# Patient Record
Sex: Male | Born: 1970 | Race: Black or African American | Hispanic: No | Marital: Married | Smoking: Never smoker
Health system: Southern US, Community
[De-identification: ages and names within clinical notes are randomized; demographics above are authoritative.]

## PROBLEM LIST (undated history)

## (undated) DIAGNOSIS — J310 Chronic rhinitis: Secondary | ICD-10-CM

## (undated) DIAGNOSIS — G4733 Obstructive sleep apnea (adult) (pediatric): Secondary | ICD-10-CM

## (undated) DIAGNOSIS — K219 Gastro-esophageal reflux disease without esophagitis: Secondary | ICD-10-CM

## (undated) DIAGNOSIS — F329 Major depressive disorder, single episode, unspecified: Secondary | ICD-10-CM

## (undated) DIAGNOSIS — R51 Headache: Secondary | ICD-10-CM

## (undated) DIAGNOSIS — F32A Depression, unspecified: Secondary | ICD-10-CM

## (undated) DIAGNOSIS — G8929 Other chronic pain: Secondary | ICD-10-CM

## (undated) HISTORY — DX: Obstructive sleep apnea (adult) (pediatric): G47.33

## (undated) HISTORY — DX: Headache: R51

## (undated) HISTORY — PX: APPENDECTOMY: SHX54

## (undated) HISTORY — DX: Chronic rhinitis: J31.0

## (undated) HISTORY — DX: Other chronic pain: G89.29

## (undated) HISTORY — PX: ACHILLES TENDON REPAIR: SUR1153

## (undated) HISTORY — DX: Gastro-esophageal reflux disease without esophagitis: K21.9

---

## 2011-05-20 ENCOUNTER — Emergency Department (HOSPITAL_BASED_OUTPATIENT_CLINIC_OR_DEPARTMENT_OTHER)
Admission: EM | Admit: 2011-05-20 | Discharge: 2011-05-20 | Disposition: A | Discharge status: Home / Self Care | Payer: Commercial Managed Care - PPO | Attending: Emergency Medicine | Admitting: Emergency Medicine

## 2011-05-20 ENCOUNTER — Emergency Department (INDEPENDENT_AMBULATORY_CARE_PROVIDER_SITE_OTHER): Payer: Commercial Managed Care - PPO

## 2011-05-20 ENCOUNTER — Encounter (HOSPITAL_BASED_OUTPATIENT_CLINIC_OR_DEPARTMENT_OTHER): Payer: Self-pay | Admitting: *Deleted

## 2011-05-20 DIAGNOSIS — R059 Cough, unspecified: Secondary | ICD-10-CM | POA: Insufficient documentation

## 2011-05-20 DIAGNOSIS — R05 Cough: Secondary | ICD-10-CM

## 2011-05-20 DIAGNOSIS — J329 Chronic sinusitis, unspecified: Secondary | ICD-10-CM | POA: Insufficient documentation

## 2011-05-20 DIAGNOSIS — J45909 Unspecified asthma, uncomplicated: Secondary | ICD-10-CM

## 2011-05-20 HISTORY — DX: Major depressive disorder, single episode, unspecified: F32.9

## 2011-05-20 HISTORY — DX: Depression, unspecified: F32.A

## 2011-05-20 MED ORDER — AZITHROMYCIN 250 MG PO TABS: 250.0000 mg | ORAL_TABLET | Freq: Every day | ORAL | Status: AC

## 2011-05-20 MED ORDER — PREDNISONE 10 MG PO TABS: 20.0000 mg | ORAL_TABLET | Freq: Every day | ORAL | Status: DC

## 2011-05-20 NOTE — Discharge Instructions (Signed)

## 2011-05-20 NOTE — ED Provider Notes (Signed)
History     CSN: [id number]  Arrival date & time 05/20/11  2119   First MD Initiated Contact with Patient 05/20/11 2241      Chief Complaint  Patient presents with  . Cough    (Consider location/radiation/quality/duration/timing/severity/associated sxs/prior treatment) Patient is a 41 y.o. male presenting with cough.  Cough    Patient with uri symptoms six weeks ago and has had continued cough.  Patient with history of pneumonia.  Saw pmd on 2 weeks ago and started on singulair and inhaler without relief.  Nonsmoker.  Cough productive of yellow to green sputum.  Patient with nasal congestion and feels like it drip down throat.    Past Medical History  Diagnosis Date  . Asthma   . Depression     Past Surgical History  Procedure Date  . Appendectomy     No family history on file.  History  Substance Use Topics  . Smoking status: Never Smoker   . Smokeless tobacco: Not on file  . Alcohol Use: Yes      Review of Systems  Respiratory: Positive for cough.   All other systems reviewed and are negative.    Allergies  Review of patient's allergies indicates not on file.  Home Medications  No current outpatient prescriptions on file.  BP 145/91  Temp(Src) 98 F (36.7 C) (Oral)  Resp 20  SpO2 98%  Physical Exam  Nursing note and vitals reviewed. Constitutional: He is oriented to person, place, and time. He appears well-developed and well-nourished.  HENT:  Head: Normocephalic.  Eyes: Conjunctivae and EOM are normal. Pupils are equal, round, and reactive to light.  Neck: Normal range of motion. Neck supple.  Cardiovascular: Normal rate and regular rhythm.   Pulmonary/Chest: Effort normal and breath sounds normal.  Abdominal: Soft. Bowel sounds are normal.  Musculoskeletal: Normal range of motion.  Neurological: He is alert and oriented to person, place, and time.  Skin: Skin is warm.  Psychiatric: He has a normal mood and affect.    ED Course    Procedures (including critical care time)  Labs Reviewed - No data to display Dg Chest 2 View  05/20/2011  *RADIOLOGY REPORT*  Clinical Data: Chronic cough; history of asthma.  CHEST - 2 VIEW  Comparison: None.  Findings: The lungs are well-aerated and clear.  There is no evidence of focal opacification, pleural effusion or pneumothorax.  The heart is normal in size; the mediastinal contour is within normal limits.  No acute osseous abnormalities are seen.  IMPRESSION: No acute cardiopulmonary process seen.  Original Report Authenticated By: Tonia Ghent, M.D.     No diagnosis found.    MDM  Patient without evidence of pneumonia on cxr.  Patient's symptoms c.w. Sinusitis and given chronicity, plan course of antibiotics and prednisone.  Patient and spouse voice understanding and agreement with plan.        Hilario Quarry, MD 05/23/11 1325

## 2011-05-20 NOTE — ED Notes (Signed)
Pt c/o cough for about 6 weeks. Pt says that he was seen by his PCP and was given inhaler and he is not feeling any better- coughing has increased.

## 2011-06-30 ENCOUNTER — Encounter: Payer: Self-pay | Admitting: Pulmonary Disease

## 2011-06-30 ENCOUNTER — Ambulatory Visit (INDEPENDENT_AMBULATORY_CARE_PROVIDER_SITE_OTHER): Payer: Commercial Managed Care - PPO | Admitting: Pulmonary Disease

## 2011-06-30 VITALS — BP 130/90 | HR 87 | Temp 98.2°F | Ht 72.0 in | Wt 361.0 lb

## 2011-06-30 DIAGNOSIS — R05 Cough: Secondary | ICD-10-CM

## 2011-06-30 DIAGNOSIS — R053 Chronic cough: Secondary | ICD-10-CM

## 2011-06-30 DIAGNOSIS — J45909 Unspecified asthma, uncomplicated: Secondary | ICD-10-CM | POA: Insufficient documentation

## 2011-06-30 DIAGNOSIS — J310 Chronic rhinitis: Secondary | ICD-10-CM

## 2011-06-30 DIAGNOSIS — R058 Other specified cough: Secondary | ICD-10-CM | POA: Insufficient documentation

## 2011-06-30 DIAGNOSIS — K219 Gastro-esophageal reflux disease without esophagitis: Secondary | ICD-10-CM

## 2011-06-30 DIAGNOSIS — R0683 Snoring: Secondary | ICD-10-CM

## 2011-06-30 DIAGNOSIS — G4733 Obstructive sleep apnea (adult) (pediatric): Secondary | ICD-10-CM

## 2011-06-30 DIAGNOSIS — R059 Cough, unspecified: Secondary | ICD-10-CM

## 2011-06-30 DIAGNOSIS — R0609 Other forms of dyspnea: Secondary | ICD-10-CM

## 2011-06-30 HISTORY — DX: Chronic rhinitis: J31.0

## 2011-06-30 HISTORY — DX: Obstructive sleep apnea (adult) (pediatric): G47.33

## 2011-06-30 HISTORY — DX: Gastro-esophageal reflux disease without esophagitis: K21.9

## 2011-06-30 MED ORDER — FLUTICASONE PROPIONATE 50 MCG/ACT NA SUSP: 2.0000 | Freq: Every day | NASAL | Status: DC

## 2011-06-30 MED ORDER — OMEPRAZOLE 20 MG PO CPDR: 20.0000 mg | DELAYED_RELEASE_CAPSULE | Freq: Every day | ORAL | Status: DC

## 2011-06-30 MED ORDER — BUDESONIDE-FORMOTEROL FUMARATE 160-4.5 MCG/ACT IN AERO: 2.0000 | INHALATION_SPRAY | Freq: Two times a day (BID) | RESPIRATORY_TRACT | Status: DC

## 2011-06-30 NOTE — Progress Notes (Signed)
Chief Complaint  Patient presents with  . Advice Only    self referral. pt c/o chornic cough--worse after eating and at night. pt also c/o of waking up gasping for air at night    History of Present Illness: Darryl Morton is a 41 y.o. male never smoker for evaluation of chronic cough.  His symptoms started about 3 years ago.  He does not recall any specific trigger.  He was followed by pulmonary in [place] before moving to [place].  He was told he had reflux, rhinitis, and asthma.  He also has exercise induced asthma.  He gets coughing spells which can sometimes be so severe as to make him vomit.  He has intermittently tried flonase and nasonex for his sinuses.  He is not sure if he was using these correctly.  They did not seem to help much.  He continues to have sinus congestion and post-nasal drip.  He will get a tickle and globus sensation in his throat.  He has not had allergy testing before, and has not had sinus scans before.  He denies skin rashes such as eczema or hives.  He had a breathing test a few years ago, and was told he has asthma.  He has been using advair for several years.  This helps some, but is irritating to his throat.  He sometimes forgets to use his inhaler.  He uses albuterol, but this does not always help.  He has been on prednisone before and this seemed to help.  He has been using singulair, but is not sure how much this helps.  He denies any reaction to aspirin.  He gets frequent episodes of heartburn.  He has intermittently used zantac, pepcid, and omeprazole.  These have helped to some degree, but he has not used any reflux therapy on a consistent basis.   His parents are smokers, but he never smoked.  He lived in [place] briefly when he was a child, but otherwise has lived in [place] or [place].  He works in Clinical biochemist as a Merchandiser, retail.  He does not have any animal exposures.  He was told that there is mold in his home.  This was tested by his  Investment banker, corporate, but he is not sure what the results of this were.   He has trouble sleeping.  He snores, and will wake up having trouble with his breathing.  His wife has heard him stop breathing.  He has trouble sleeping on his back, and his mouth gets dry.  He can fall asleep easily when sitting quiet.  His Epworth score is 8 out of 24.   Past Medical History  Diagnosis Date  . Asthma   . Depression   . Chronic headaches     Past Surgical History  Procedure Date  . Appendectomy   . Achilles tendon repair     right    Current Outpatient Prescriptions on File Prior to Visit  Medication Sig Dispense Refill  . ADVAIR HFA 115-21 MCG/ACT inhaler 2 puffs twice a day      . albuterol (PROVENTIL HFA;VENTOLIN HFA) 108 (90 BASE) MCG/ACT inhaler Inhale 2 puffs into the lungs every 6 (six) hours as needed.      . loratadine (CLARITIN) 10 MG tablet Take 10 mg by mouth daily.      . montelukast (SINGULAIR) 10 MG tablet Once a day      . ranitidine (ZANTAC) 75 MG tablet Take 75 mg by mouth as needed.      Marland Kitchen  venlafaxine XR (EFFEXOR-XR) 150 MG 24 hr capsule Once a day        No Known Allergies  family history includes Allergies in an unspecified family member and Asthma in an unspecified family member.   reports that he has never smoked. He does not have any smokeless tobacco history on file. He reports that he drinks alcohol. He reports that he does not use illicit drugs.  Review of Systems  Constitutional: Positive for appetite change. Negative for unexpected weight change.  HENT: Positive for congestion and sore throat. Negative for trouble swallowing and dental problem.   Respiratory: Positive for cough and shortness of breath.   Cardiovascular: Positive for chest pain. Negative for palpitations and leg swelling.  Musculoskeletal: Negative for joint swelling.  Skin: Negative for rash.  Neurological: Positive for headaches.  Psychiatric/Behavioral: Positive for dysphoric mood. The  patient is nervous/anxious.     Physical Exam: BP 130/90  Pulse 87  Temp(Src) 98.2 F (36.8 C) (Oral)  Ht 6' (1.829 m)  Wt 361 lb (163.749 kg)  BMI 48.96 kg/m2  SpO2 96% Body mass index is 48.96 kg/(m^2).  General - Obese HEENT - PERRLA, EOMI, wears glasses, no sinus tenderness, boggy nasal mucosa, clear nasal discharge, MP 3, 2+ tonsils, no LAN, TM clear Cardiac - s1s2 regular, no murmur Chest - good air entry, no wheeze/rales/dullness Abdomen - obese, soft, nontender, + bowel sounds, no organomegaly Extremities - no e/c/c Neurologic - normal strength, CN intact Skin - no rashes Psychiatric - normal mood, behavior   CHEST - 2 VIEW 05/20/11 Comparison: None.  Findings: The lungs are well-aerated and clear. There is no evidence of focal opacification, pleural effusion or pneumothorax.  The heart is normal in size; the mediastinal contour is within normal limits. No acute osseous abnormalities are seen.  IMPRESSION:  No acute cardiopulmonary process seen.  Original Report Authenticated By: Tonia Ghent, M.D.   Assessment/Plan:  Outpatient Encounter Prescriptions as of 06/30/2011  Medication Sig Dispense Refill  . ADVAIR HFA 115-21 MCG/ACT inhaler 2 puffs twice a day      . albuterol (PROVENTIL HFA;VENTOLIN HFA) 108 (90 BASE) MCG/ACT inhaler Inhale 2 puffs into the lungs every 6 (six) hours as needed.      . loratadine (CLARITIN) 10 MG tablet Take 10 mg by mouth daily.      . montelukast (SINGULAIR) 10 MG tablet Once a day      . ranitidine (ZANTAC) 75 MG tablet Take 75 mg by mouth as needed.      . venlafaxine XR (EFFEXOR-XR) 150 MG 24 hr capsule Once a day      . DISCONTD: predniSONE (DELTASONE) 10 MG tablet Take 2 tablets (20 mg total) by mouth daily.  15 tablet  0    Joelly Bolanos Pager:  [phone] 06/30/2011, 4:38 PM

## 2011-06-30 NOTE — Assessment & Plan Note (Signed)
He has normal chest xray, and no history of smoking.  Likely related to chronic rhinitis with postnasal drip, asthma, and chronic reflux.

## 2011-06-30 NOTE — Patient Instructions (Signed)
Nasal irrigation (saline nasal rinse) daily Flonase two sprays each nostril daily after using nasal irrigation Salt water gargles as needed Sugarless candy to keep mouth moist Drink sip of water when you have urge to cough Symbicort two puffs twice per day, and rinse mouth after each use Stop advair while using symbicort Continue singulair 10 mg one pill nightly before bedtime Continue claritin 10 mg as needed for allergies Albuterol two puffs as needed for cough, wheeze, or chest congestion Omeprazole 20 mg one pill daily 30 minutes before first meal of the day Will schedule sleep study Will get records for lung doctor in [place] Follow up in 3 to 4 weeks

## 2011-06-30 NOTE — Assessment & Plan Note (Signed)
He has snoring, witnessed apnea, sleep disruption, and daytime sleepiness.  I am concerned he may have sleep apnea.  I have explained how sleep apnea can affect the patient's health.  Driving precautions and importance of weight loss were discussed.  Treatment options for sleep apnea were reviewed.  To further assess will arrange for in-lab sleep study.

## 2011-06-30 NOTE — Assessment & Plan Note (Signed)
Will have him use omeprazole on a daily basis for the next several weeks.  Explained to him the proper timing for using omeprazole.  Depending on his response will decide if he needs further evaluation by GI.

## 2011-06-30 NOTE — Assessment & Plan Note (Signed)
Advised him to use nasal irrigation and flonase on daily basis until re-evaluated next visit.  He is to also continue nightly singulair, and claritin as needed.  Will get copy of his medical records from [place] and decide if he needs to have additional allergy testing or imaging studies of his sinuses.

## 2011-06-30 NOTE — Assessment & Plan Note (Signed)
He has a history of asthma, and was followed by pulmonary doctor in [place] before moving to [place].  Will get copy of his records from [place].  Will determine if he needs additional pulmonary function testing after review of his records.  Will have him try symbicort in place of advair to see if this is less irritating to his throat.  I have given him a sample of symbicort.  He is to continue singulair and as needed albuterol.

## 2011-06-30 NOTE — Progress Notes (Deleted)
  Subjective:    Patient ID: Darryl Morton, male    DOB: [birth year], 41 y.o.   MRN: [id number]  HPI    Review of Systems  Constitutional: Positive for appetite change. Negative for unexpected weight change.  HENT: Positive for congestion and sore throat. Negative for trouble swallowing and dental problem.   Respiratory: Positive for cough and shortness of breath.   Cardiovascular: Positive for chest pain. Negative for palpitations and leg swelling.  Musculoskeletal: Negative for joint swelling.  Skin: Negative for rash.  Neurological: Positive for headaches.  Psychiatric/Behavioral: Positive for dysphoric mood. The patient is nervous/anxious.   Heartburn/indigestion     Objective:   Physical Exam        Assessment & Plan:

## 2011-08-04 ENCOUNTER — Encounter: Payer: Self-pay | Admitting: Pulmonary Disease

## 2011-08-04 ENCOUNTER — Ambulatory Visit (INDEPENDENT_AMBULATORY_CARE_PROVIDER_SITE_OTHER): Payer: Commercial Managed Care - PPO | Admitting: Pulmonary Disease

## 2011-08-04 VITALS — BP 126/84 | HR 98 | Temp 98.7°F | Ht >= 80 in | Wt 355.0 lb

## 2011-08-04 DIAGNOSIS — R0989 Other specified symptoms and signs involving the circulatory and respiratory systems: Secondary | ICD-10-CM

## 2011-08-04 DIAGNOSIS — R05 Cough: Secondary | ICD-10-CM

## 2011-08-04 DIAGNOSIS — J310 Chronic rhinitis: Secondary | ICD-10-CM

## 2011-08-04 DIAGNOSIS — R053 Chronic cough: Secondary | ICD-10-CM

## 2011-08-04 DIAGNOSIS — R0609 Other forms of dyspnea: Secondary | ICD-10-CM

## 2011-08-04 DIAGNOSIS — K219 Gastro-esophageal reflux disease without esophagitis: Secondary | ICD-10-CM

## 2011-08-04 DIAGNOSIS — R059 Cough, unspecified: Secondary | ICD-10-CM

## 2011-08-04 DIAGNOSIS — R0683 Snoring: Secondary | ICD-10-CM

## 2011-08-04 MED ORDER — BUDESONIDE-FORMOTEROL FUMARATE 160-4.5 MCG/ACT IN AERO: 2.0000 | INHALATION_SPRAY | Freq: Two times a day (BID) | RESPIRATORY_TRACT | Status: DC

## 2011-08-04 NOTE — Assessment & Plan Note (Signed)
Advised him to use nasal irrigation and flonase on daily basis for now.  He is to also continue nightly singulair, and claritin as needed.  Will get copy of his medical records from [place] and decide if he needs to have additional allergy testing or imaging studies of his sinuses.

## 2011-08-04 NOTE — Assessment & Plan Note (Signed)
He is schedule for his sleep study tomorrow.  Will call him with results.

## 2011-08-04 NOTE — Assessment & Plan Note (Signed)
He has normal chest xray, and no history of smoking.  Likely related to chronic rhinitis with postnasal drip, asthma, and chronic reflux. 

## 2011-08-04 NOTE — Assessment & Plan Note (Signed)
He has a history of asthma, and was followed by pulmonary doctor in [place] before moving to [place].  Will get copy of his records from [place].  Will determine if he needs additional pulmonary function testing after review of his records.  Will continue symbicort and singulair with prn albuterol.

## 2011-08-04 NOTE — Patient Instructions (Signed)
Will call with results of sleep test Will request records from doctors in [place] Follow up in 2 months

## 2011-08-04 NOTE — Progress Notes (Signed)
Chief Complaint  Patient presents with  . Cough    pt states cough is improving.     History of Present Illness: Darryl Morton is a 41 y.o. male never smoker for evaluation of chronic cough from post-nasal drip, asthma, and GERD.  Also hx of snoring.  His cough is better.  He is now only using albuterol 2 or 3 times per week.  Neti pott helps with his sinuses.  He is using omeprazole, and this has helped.  He likes symbicort better than advair.  I have not received his records from [facility].  Past Medical History  Diagnosis Date  . Asthma   . Depression   . Chronic headaches     Past Surgical History  Procedure Date  . Appendectomy   . Achilles tendon repair     right    No Known Allergies  Physical Exam:  Blood pressure 126/84, pulse 98, temperature 98.7 F (37.1 C), temperature source Oral, height 6\' 10"  (2.083 m), weight 355 lb (161.027 kg), SpO2 96.00%. Body mass index is 37.12 kg/(m^2). Wt Readings from Last 2 Encounters:  08/04/11 355 lb (161.027 kg)  06/30/11 361 lb (163.749 kg)    General - Obese  HEENT - PERRLA, EOMI, wears glasses, no sinus tenderness, boggy nasal mucosa, clear nasal discharge, MP 3, 2+ tonsils, no LAN, TM clear  Cardiac - s1s2 regular, no murmur  Chest - good air entry, no wheeze/rales/dullness  Abdomen - obese, soft, nontender, + bowel sounds, no organomegaly  Extremities - no e/c/c  Neurologic - normal strength, CN intact  Skin - no rashes  Psychiatric - normal mood, behavior   Assessment/Plan:  Outpatient Encounter Prescriptions as of 08/04/2011  Medication Sig Dispense Refill  . albuterol (PROVENTIL HFA;VENTOLIN HFA) 108 (90 BASE) MCG/ACT inhaler Inhale 2 puffs into the lungs every 6 (six) hours as needed.      . budesonide-formoterol (SYMBICORT) 160-4.5 MCG/ACT inhaler Inhale 2 puffs into the lungs 2 (two) times daily.  1 Inhaler  12  . fluticasone (FLONASE) 50 MCG/ACT nasal spray Place 2 sprays into the nose daily.    2  .  loratadine (CLARITIN) 10 MG tablet Take 10 mg by mouth daily.      . montelukast (SINGULAIR) 10 MG tablet Once a day      . omeprazole (PRILOSEC) 20 MG capsule Take 1 capsule (20 mg total) by mouth daily.      . ranitidine (ZANTAC) 75 MG tablet Take 75 mg by mouth as needed.      . venlafaxine XR (EFFEXOR-XR) 150 MG 24 hr capsule Once a day      . DISCONTDGarlon Hatchet UJW 119-14 MCG/ACT inhaler 2 puffs twice a day        Damiano Stamper Pager:  [phone] 08/04/2011, 3:11 PM

## 2011-08-04 NOTE — Assessment & Plan Note (Signed)
Will have him conitnue omeprazole on a daily basis for now.

## 2011-08-05 ENCOUNTER — Ambulatory Visit (HOSPITAL_BASED_OUTPATIENT_CLINIC_OR_DEPARTMENT_OTHER): Payer: Commercial Managed Care - PPO | Attending: Pulmonary Disease | Admitting: Radiology

## 2011-08-05 VITALS — Ht >= 80 in | Wt 355.0 lb

## 2011-08-05 DIAGNOSIS — R0989 Other specified symptoms and signs involving the circulatory and respiratory systems: Secondary | ICD-10-CM | POA: Insufficient documentation

## 2011-08-05 DIAGNOSIS — R0609 Other forms of dyspnea: Secondary | ICD-10-CM | POA: Insufficient documentation

## 2011-08-05 DIAGNOSIS — G4733 Obstructive sleep apnea (adult) (pediatric): Secondary | ICD-10-CM | POA: Insufficient documentation

## 2011-08-05 DIAGNOSIS — R0683 Snoring: Secondary | ICD-10-CM

## 2011-08-08 DIAGNOSIS — R0989 Other specified symptoms and signs involving the circulatory and respiratory systems: Secondary | ICD-10-CM

## 2011-08-08 DIAGNOSIS — R0609 Other forms of dyspnea: Secondary | ICD-10-CM

## 2011-08-08 DIAGNOSIS — G4733 Obstructive sleep apnea (adult) (pediatric): Secondary | ICD-10-CM

## 2011-08-08 NOTE — Procedures (Signed)
NAME:  Darryl Morton, Darryl Morton NO.:  [id number]  MEDICAL RECORD NO.:  [id number]          PATIENT TYPE:  OUT  LOCATION:  SLEEP CENTER                 FACILITY:  [facility]  PHYSICIAN:  Coralyn Helling, MD        DATE OF BIRTH:  12/07/1970  DATE OF STUDY:  08/05/2011                           NOCTURNAL POLYSOMNOGRAM  REFERRING PHYSICIAN:  Coralyn Helling, MD  INDICATION FOR STUDY:  Darryl Morton is a 41 year old male who has a history of snoring, sleep disruption, witnessed apnea and daytime sleepiness.  He is referred to the sleep lab for evaluation of hypersomnia with obstructive sleep apnea.  Height is 6 feet 9 inches, weight is 355 pounds.  BMI is 38.  Neck size is 19.5 inches.  EPWORTH SLEEPINESS SCORE:  22.  MEDICATIONS:  Effexor, Advair, Singulair, Symbicort, Claritin.  SLEEP ARCHITECTURE:  The patient followed a split-night study protocol. During the diagnostic portion of the study, the total recording time was 132 minutes.  Total sleep time was 124 minutes.  Sleep efficiency was 94%.  Sleep latency was 2.5 minutes.  This portion of the study was notable for the lack of stage 3 sleep with REM sleep.  He slept predominantly in the non-supine position.  During the titration portion of the study, total recording time was 248 minutes.  Total sleep time was 214 minutes.  Sleep efficiency was 86%.  Sleep latency was 1 minute. REM latency was 105 minutes.  This portion of the study was notable for lack of stage 3 sleep.  He slept in both supine and non-supine positions.  RESPIRATORY DATA:  The average respiratory rate was 16.  Loud snoring was noted by the technician.  During the diagnostic portion of the study, the overall apnea-hypopnea index was 32.8.  There were 23 central apneic events with mixed respiratory event.  The remainder of the events were obstructive in nature.  During the titration portion of the study, the patient was started on CPAP of 5 and increased to 10 cm  of water.  He was then transitioned to BiPAP at 12/8.  With CPAP at 9 cm of water, he appeared to have a good control of his sleep-disordered breathing.  At this pressure setting, his AHI was reduced to 9 and he was observed in REM sleep and supine sleep.  Of note, at higher pressure settings, he had difficulties with central apneic events.  OXYGEN DATA:  The baseline oxygenation was 95%.  The oxygen saturation nadir was 82%.  The study was conducted without the use of supplemental oxygen.  CARDIAC DATA:  The average heart rate was 66 and the rhythm strip showed sinus rhythm with occasional PVCs.  MOVEMENT-PARASOMNIA:  The periodic limb movement index was 22.7.  During the diagnostic portion of the study and during the therapeutic portion of the study, the patient had no restroom trips.  IMPRESSIONS-RECOMMENDATIONS:  This study shows evidence for severe obstructive sleep apnea with an apnea-hypopnea index of 32.8 and oxygen saturation rate of 82%.  He had good control of his sleep-disordered breathing with CPAP at 9 cm water.  Of note, at higher pressure settings he had difficulties with central apneic events.  In addition to diet, exercise and weight reduction, I had recommend the patient be started on CPAP at 9 cm water and monitored for his clinical response.     Coralyn Helling, MD Diplomat, American Board of Sleep Medicine    VS/MEDQ  D:  08/08/2011 15:08:10  T:  08/08/2011 21:51:16  Job:  [id number]

## 2011-08-10 ENCOUNTER — Encounter: Payer: Self-pay | Admitting: Pulmonary Disease

## 2011-08-10 ENCOUNTER — Telehealth: Payer: Self-pay | Admitting: Pulmonary Disease

## 2011-08-10 DIAGNOSIS — R0683 Snoring: Secondary | ICD-10-CM

## 2011-08-10 NOTE — Telephone Encounter (Signed)
PSG 08/05/11>>AHI 32.8, SpO2 82%. CPAP 9 cm H2O>> 9 (mostly central hypopneas), +R, +S  Left message on pt's voicemail detailing.  Will place order to start CPAP 9 cm H2O.  Will have my nurse confirm receipt of message, and ensure he has ROV 2 months after CPAP set up.

## 2011-08-11 NOTE — Telephone Encounter (Signed)
lmomtcb x1 for pt 

## 2011-08-12 NOTE — Telephone Encounter (Signed)
lmomtcb x 2  

## 2011-08-16 NOTE — Telephone Encounter (Signed)
I spoke with pt and he did receive VS message. Pt scheduled to come in 10/21/11 at 1:30 for follow up

## 2011-09-01 ENCOUNTER — Telehealth: Payer: Self-pay | Admitting: Pulmonary Disease

## 2011-09-01 NOTE — Telephone Encounter (Signed)
This is FYI for VS-will send to him and sign message per protocol.

## 2011-09-02 NOTE — Telephone Encounter (Signed)
Noted  

## 2011-10-21 ENCOUNTER — Ambulatory Visit: Payer: Commercial Managed Care - PPO | Admitting: Pulmonary Disease

## 2011-11-16 ENCOUNTER — Ambulatory Visit: Payer: Commercial Managed Care - PPO | Admitting: Pulmonary Disease

## 2012-01-10 ENCOUNTER — Emergency Department (HOSPITAL_BASED_OUTPATIENT_CLINIC_OR_DEPARTMENT_OTHER)
Admission: EM | Admit: 2012-01-10 | Discharge: 2012-01-10 | Disposition: A | Discharge status: Home / Self Care | Payer: Commercial Managed Care - PPO | Attending: Emergency Medicine | Admitting: Emergency Medicine

## 2012-01-10 ENCOUNTER — Encounter (HOSPITAL_BASED_OUTPATIENT_CLINIC_OR_DEPARTMENT_OTHER): Payer: Self-pay | Admitting: *Deleted

## 2012-01-10 ENCOUNTER — Emergency Department (HOSPITAL_BASED_OUTPATIENT_CLINIC_OR_DEPARTMENT_OTHER): Payer: Commercial Managed Care - PPO

## 2012-01-10 DIAGNOSIS — W108XXA Fall (on) (from) other stairs and steps, initial encounter: Secondary | ICD-10-CM | POA: Insufficient documentation

## 2012-01-10 DIAGNOSIS — Y929 Unspecified place or not applicable: Secondary | ICD-10-CM | POA: Insufficient documentation

## 2012-01-10 DIAGNOSIS — Z79899 Other long term (current) drug therapy: Secondary | ICD-10-CM | POA: Insufficient documentation

## 2012-01-10 DIAGNOSIS — S8390XA Sprain of unspecified site of unspecified knee, initial encounter: Secondary | ICD-10-CM

## 2012-01-10 DIAGNOSIS — G4733 Obstructive sleep apnea (adult) (pediatric): Secondary | ICD-10-CM | POA: Insufficient documentation

## 2012-01-10 DIAGNOSIS — J45909 Unspecified asthma, uncomplicated: Secondary | ICD-10-CM | POA: Insufficient documentation

## 2012-01-10 DIAGNOSIS — F3289 Other specified depressive episodes: Secondary | ICD-10-CM | POA: Insufficient documentation

## 2012-01-10 DIAGNOSIS — F329 Major depressive disorder, single episode, unspecified: Secondary | ICD-10-CM | POA: Insufficient documentation

## 2012-01-10 DIAGNOSIS — IMO0002 Reserved for concepts with insufficient information to code with codable children: Secondary | ICD-10-CM | POA: Insufficient documentation

## 2012-01-10 DIAGNOSIS — K219 Gastro-esophageal reflux disease without esophagitis: Secondary | ICD-10-CM | POA: Insufficient documentation

## 2012-01-10 DIAGNOSIS — M25469 Effusion, unspecified knee: Secondary | ICD-10-CM | POA: Insufficient documentation

## 2012-01-10 DIAGNOSIS — Y9301 Activity, walking, marching and hiking: Secondary | ICD-10-CM | POA: Insufficient documentation

## 2012-01-10 MED ORDER — NAPROXEN 500 MG PO TABS: 500.0000 mg | ORAL_TABLET | Freq: Two times a day (BID) | ORAL | Status: DC

## 2012-01-10 MED ORDER — HYDROCODONE-ACETAMINOPHEN 5-500 MG PO TABS: 1.0000 | ORAL_TABLET | Freq: Four times a day (QID) | ORAL | Status: DC | PRN

## 2012-01-10 NOTE — ED Notes (Signed)
Pt c/o left knee pain after injury x 7 days ago

## 2012-01-10 NOTE — ED Provider Notes (Addendum)
History     CSN: [id number]  Arrival date & time 01/10/12  Darryl Morton   First MD Initiated Contact with Patient 01/10/12 1849      Chief Complaint  Patient presents with  . Knee Injury    (Consider location/radiation/quality/duration/timing/severity/associated sxs/prior treatment) Patient is a 41 y.o. male presenting with knee pain. The history is provided by the patient.  Knee Pain This is a new problem. Episode onset: 5 days ago. The problem occurs constantly. The problem has been gradually worsening. Associated symptoms comments: Larey Seat down 1 step and landed on left knee. More swelling recently and feels loose with walking. The symptoms are aggravated by walking and bending. The symptoms are relieved by ice and rest. He has tried nothing for the symptoms. The treatment provided no relief.    Past Medical History  Diagnosis Date  . Asthma   . Depression   . Chronic headaches   . Chronic rhinitis 06/30/2011  . GERD (gastroesophageal reflux disease) 06/30/2011  . OSA (obstructive sleep apnea) 06/30/2011    PSG 08/05/11>>AHI 32.8, SpO2 82%. CPAP 9 cm H2O>> 9 (mostly central hypopneas), +R, +S     Past Surgical History  Procedure Date  . Appendectomy   . Achilles tendon repair     right    Family History  Problem Relation Age of Onset  . Asthma      father side  . Allergies      father side    History  Substance Use Topics  . Smoking status: Never Smoker   . Smokeless tobacco: Not on file     Comment: exposed to second hand smoke  . Alcohol Use: Yes      Review of Systems  All other systems reviewed and are negative.    Allergies  Review of patient's allergies indicates no known allergies.  Home Medications   Current Outpatient Rx  Name  Route  Sig  Dispense  Refill  . ALBUTEROL SULFATE HFA 108 (90 BASE) MCG/ACT IN AERS   Inhalation   Inhale 2 puffs into the lungs every 6 (six) hours as needed.         . BUDESONIDE-FORMOTEROL FUMARATE 160-4.5 MCG/ACT IN  AERO   Inhalation   Inhale 2 puffs into the lungs 2 (two) times daily.   1 Inhaler   12   . FLUTICASONE PROPIONATE 50 MCG/ACT NA SUSP   Nasal   Place 2 sprays into the nose daily.      2   . LORATADINE 10 MG PO TABS   Oral   Take 10 mg by mouth daily.         Marland Kitchen MONTELUKAST SODIUM 10 MG PO TABS      Once a day         . OMEPRAZOLE 20 MG PO CPDR   Oral   Take 1 capsule (20 mg total) by mouth daily.         Marland Kitchen RANITIDINE HCL 75 MG PO TABS   Oral   Take 75 mg by mouth as needed.         . VENLAFAXINE HCL ER 150 MG PO CP24      Once a day           BP 146/83  Pulse 87  Temp 98.9 F (37.2 C) (Oral)  Resp 16  Ht 6\' 9"  (2.057 m)  Wt 345 lb (156.491 kg)  BMI 36.97 kg/m2  SpO2 99%  Physical Exam  Nursing note and vitals reviewed.  Constitutional: He is oriented to person, place, and time. He appears well-developed and well-nourished. No distress.  HENT:  Head: Normocephalic and atraumatic.  Eyes: EOM are normal. Pupils are equal, round, and reactive to light.  Musculoskeletal:       Left knee: He exhibits swelling, effusion and MCL laxity. He exhibits normal range of motion, no deformity and no LCL laxity. tenderness found. Medial joint line and MCL tenderness noted. No lateral joint line tenderness noted.  Neurological: He is alert and oriented to person, place, and time.  Skin: Skin is warm and dry. No rash noted. No erythema.    ED Course  Procedures (including critical care time)  Labs Reviewed - No data to display Dg Knee Complete 4 Views Left  01/10/2012  *RADIOLOGY REPORT*  Clinical Data: Fall, left knee injury/pain  LEFT KNEE - COMPLETE 4+ VIEW  Comparison: None.  Findings: No fracture or dislocation is seen.  Joint spaces are essentially preserved.  Moderate suprapatellar knee joint effusion.  IMPRESSION: No fracture or dislocation is seen.  Moderate suprapatellar knee joint effusion.   Original Report Authenticated By: Charline Bills, M.D.       1. Knee sprain   2. Knee effusion       MDM   Patient with a mechanical fall 5 days ago down one step landing on his left knee with persistent pain and swelling of the. On exam he has mild looseness with MCL testing. He has evidence of knee effusion. Plain film with joint effusion.  Will give ortho f/u for further care and KI placed.  Pt placed on anti-inflammatories      Gwyneth Sprout, MD 01/10/12 1929  Gwyneth Sprout, MD 01/10/12 1930

## 2012-02-07 ENCOUNTER — Emergency Department (HOSPITAL_BASED_OUTPATIENT_CLINIC_OR_DEPARTMENT_OTHER)
Admission: EM | Admit: 2012-02-07 | Discharge: 2012-02-07 | Disposition: A | Discharge status: Home / Self Care | Payer: Self-pay | Attending: Emergency Medicine | Admitting: Emergency Medicine

## 2012-02-07 ENCOUNTER — Encounter (HOSPITAL_BASED_OUTPATIENT_CLINIC_OR_DEPARTMENT_OTHER): Payer: Self-pay

## 2012-02-07 DIAGNOSIS — J45909 Unspecified asthma, uncomplicated: Secondary | ICD-10-CM

## 2012-02-07 DIAGNOSIS — R062 Wheezing: Secondary | ICD-10-CM

## 2012-02-07 DIAGNOSIS — Z791 Long term (current) use of non-steroidal anti-inflammatories (NSAID): Secondary | ICD-10-CM | POA: Insufficient documentation

## 2012-02-07 DIAGNOSIS — F329 Major depressive disorder, single episode, unspecified: Secondary | ICD-10-CM | POA: Insufficient documentation

## 2012-02-07 DIAGNOSIS — Z9889 Other specified postprocedural states: Secondary | ICD-10-CM | POA: Insufficient documentation

## 2012-02-07 DIAGNOSIS — J45901 Unspecified asthma with (acute) exacerbation: Secondary | ICD-10-CM | POA: Insufficient documentation

## 2012-02-07 DIAGNOSIS — Z8679 Personal history of other diseases of the circulatory system: Secondary | ICD-10-CM | POA: Insufficient documentation

## 2012-02-07 DIAGNOSIS — K219 Gastro-esophageal reflux disease without esophagitis: Secondary | ICD-10-CM | POA: Insufficient documentation

## 2012-02-07 DIAGNOSIS — G4733 Obstructive sleep apnea (adult) (pediatric): Secondary | ICD-10-CM | POA: Insufficient documentation

## 2012-02-07 DIAGNOSIS — F3289 Other specified depressive episodes: Secondary | ICD-10-CM | POA: Insufficient documentation

## 2012-02-07 DIAGNOSIS — J309 Allergic rhinitis, unspecified: Secondary | ICD-10-CM | POA: Insufficient documentation

## 2012-02-07 DIAGNOSIS — Z79899 Other long term (current) drug therapy: Secondary | ICD-10-CM | POA: Insufficient documentation

## 2012-02-07 MED ORDER — ALBUTEROL SULFATE HFA 108 (90 BASE) MCG/ACT IN AERS: 2.0000 | INHALATION_SPRAY | RESPIRATORY_TRACT | Status: DC | PRN

## 2012-02-07 MED ORDER — IPRATROPIUM BROMIDE 0.02 % IN SOLN
0.5000 mg | Freq: Once | RESPIRATORY_TRACT | Status: AC
  Administered 2012-02-07: 0.5 mg via RESPIRATORY_TRACT
  Filled 2012-02-07: qty 2.5

## 2012-02-07 MED ORDER — PREDNISONE 10 MG PO TABS
60.0000 mg | ORAL_TABLET | Freq: Once | ORAL | Status: AC
  Administered 2012-02-07: 60 mg via ORAL
  Filled 2012-02-07: qty 1

## 2012-02-07 MED ORDER — PREDNISONE 20 MG PO TABS: 60.0000 mg | ORAL_TABLET | Freq: Every day | ORAL | Status: DC

## 2012-02-07 MED ORDER — ALBUTEROL SULFATE (5 MG/ML) 0.5% IN NEBU
5.0000 mg | INHALATION_SOLUTION | Freq: Once | RESPIRATORY_TRACT | Status: AC
  Administered 2012-02-07: 5 mg via RESPIRATORY_TRACT
  Filled 2012-02-07: qty 1

## 2012-02-07 NOTE — ED Notes (Signed)
MD at bedside. 

## 2012-02-07 NOTE — ED Notes (Signed)
Pt reports intermittent cough and cold symptoms x 3 weeks but developed wheexing last night.  Pt has been out of his medication x 2-3 months.

## 2012-02-07 NOTE — ED Notes (Signed)
RRT at bedside for neb tx

## 2012-02-07 NOTE — ED Provider Notes (Signed)
History     CSN: [id number]  Arrival date & time 02/07/12  1212   First MD Initiated Contact with Patient 02/07/12 1223      Chief Complaint  Patient presents with  . Cough  . URI    (Consider location/radiation/quality/duration/timing/severity/associated sxs/prior treatment) The history is provided by the patient.  pt w hx asthma, states in past 2-3 weeks increased wheezing. Has noted occasional non productive cough, nasal congestion/rhinorrhea. Denies sore throat. No fever or chills. No chest pain. w ashtma, states uses mdi prn. w albuterol use, transient improvement in symptoms. Denies prior admission related to asthma or breathing problems. No recent oral steroid use. No leg pain or swelling. No orthopnea or pnd.      Past Medical History  Diagnosis Date  . Asthma   . Depression   . Chronic headaches   . Chronic rhinitis 06/30/2011  . GERD (gastroesophageal reflux disease) 06/30/2011  . OSA (obstructive sleep apnea) 06/30/2011    PSG 08/05/11>>AHI 32.8, SpO2 82%. CPAP 9 cm H2O>> 9 (mostly central hypopneas), +R, +S     Past Surgical History  Procedure Date  . Appendectomy   . Achilles tendon repair     right    Family History  Problem Relation Age of Onset  . Asthma      father side  . Allergies      father side    History  Substance Use Topics  . Smoking status: Never Smoker   . Smokeless tobacco: Not on file     Comment: exposed to second hand smoke  . Alcohol Use: No      Review of Systems  Constitutional: Negative for fever.  HENT: Negative for neck pain.   Eyes: Negative for redness.  Respiratory: Positive for cough, shortness of breath and wheezing.   Cardiovascular: Negative for chest pain and leg swelling.  Gastrointestinal: Negative for abdominal pain.  Genitourinary: Negative for flank pain.  Musculoskeletal: Negative for back pain.  Skin: Negative for rash.  Neurological: Negative for headaches.  Hematological: Does not bruise/bleed  easily.  Psychiatric/Behavioral: Negative for confusion.    Allergies  Review of patient's allergies indicates no known allergies.  Home Medications   Current Outpatient Rx  Name  Route  Sig  Dispense  Refill  . ALBUTEROL SULFATE HFA 108 (90 BASE) MCG/ACT IN AERS   Inhalation   Inhale 2 puffs into the lungs every 6 (six) hours as needed.         . BUDESONIDE-FORMOTEROL FUMARATE 160-4.5 MCG/ACT IN AERO   Inhalation   Inhale 2 puffs into the lungs 2 (two) times daily.   1 Inhaler   12   . FLUTICASONE PROPIONATE 50 MCG/ACT NA SUSP   Nasal   Place 2 sprays into the nose daily.      2   . HYDROCODONE-ACETAMINOPHEN 5-500 MG PO TABS   Oral   Take 1-2 tablets by mouth every 6 (six) hours as needed for pain.   15 tablet   0   . LORATADINE 10 MG PO TABS   Oral   Take 10 mg by mouth daily.         Marland Kitchen MONTELUKAST SODIUM 10 MG PO TABS      Once a day         . NAPROXEN 500 MG PO TABS   Oral   Take 1 tablet (500 mg total) by mouth 2 (two) times daily.   30 tablet   0   . OMEPRAZOLE  20 MG PO CPDR   Oral   Take 1 capsule (20 mg total) by mouth daily.         Marland Kitchen RANITIDINE HCL 75 MG PO TABS   Oral   Take 75 mg by mouth as needed.         . VENLAFAXINE HCL ER 150 MG PO CP24      Once a day           BP 135/86  Pulse 70  Temp 97.9 F (36.6 C) (Oral)  Resp 16  Ht 6' 9.5" (2.07 m)  Wt 320 lb (145.151 kg)  BMI 33.87 kg/m2  SpO2 100%  Physical Exam  Nursing note and vitals reviewed. Constitutional: He is oriented to person, place, and time. He appears well-developed and well-nourished. No distress.  HENT:  Head: Atraumatic.  Mouth/Throat: Oropharynx is clear and moist.       Nasal congestion.   Eyes: Pupils are equal, round, and reactive to light.  Neck: Neck supple. No JVD present. No tracheal deviation present.  Cardiovascular: Normal rate, regular rhythm, normal heart sounds and intact distal pulses.  Exam reveals no gallop and no friction rub.    No murmur heard. Pulmonary/Chest: Effort normal. No accessory muscle usage. No respiratory distress. He has wheezes.  Abdominal: Soft. He exhibits no distension. There is no tenderness.  Musculoskeletal: Normal range of motion. He exhibits no edema and no tenderness.  Neurological: He is alert and oriented to person, place, and time.  Skin: Skin is warm and dry.  Psychiatric: He has a normal mood and affect.    ED Course  Procedures (including critical care time)     MDM  Albuterol and atrovent neb. pred po.  Reviewed nursing notes and prior charts for additional history.   Recheck wheezing resolved. No increased wob. Pt feels much better.         Suzi Roots, MD 02/07/12 [phone]

## 2013-02-08 ENCOUNTER — Telehealth: Payer: Self-pay | Admitting: Pulmonary Disease

## 2013-02-08 DIAGNOSIS — G4733 Obstructive sleep apnea (adult) (pediatric): Secondary | ICD-10-CM

## 2013-02-08 NOTE — Telephone Encounter (Signed)
Called and spoke with pt and he stated that he needs rx for the cpap and the sleep study to be sent in to [facility].   Pt stated that he is sleepy everyday now  And he has never had a cpap.  Pt was last seen by VS on 07/2011.  Pt is aware that he may need OV with [facility] before this can be set up and pt stated that this was ok and he is willing to come in for appt.  Pt is aware that VS is back on Monday.  VS please advise. Thanks  No Known Allergies

## 2013-02-10 NOTE — Telephone Encounter (Signed)
Okay to send order for auto CPAP range 5 to 15 cm H2O with heated humidity and mask of choice.  Please schedule him for ROV with me in months after CPAP set up.

## 2013-02-11 NOTE — Telephone Encounter (Signed)
ATC pt- na wcb 

## 2013-02-12 NOTE — Telephone Encounter (Signed)
Pt returned call.  Holly D Pryor ° °

## 2013-02-12 NOTE — Telephone Encounter (Signed)
Called, spoke with pt -  Informed him of below per Dr. Craige Cotta.  He verbalized understanding of this and would like order to be sent to [facility].  Pt is requesting cpap to be set up prior to the end of the year.  I have placed this request in order.  Pt is aware he will need OV with [facility] 2 months after being set up with cpap.  Feb schedule is not out yet.  I have placed a reminder in the system for Feb and have also asked pt to call back after being set up with cpap to see if schedule is out yet.  He verbalized understanding of this and voiced no further questions or concerns at this time.

## 2013-02-12 NOTE — Telephone Encounter (Signed)
lmtcb x1 for pt. 

## 2013-06-28 ENCOUNTER — Emergency Department (HOSPITAL_BASED_OUTPATIENT_CLINIC_OR_DEPARTMENT_OTHER): Payer: BC Managed Care – PPO

## 2013-06-28 ENCOUNTER — Emergency Department (HOSPITAL_BASED_OUTPATIENT_CLINIC_OR_DEPARTMENT_OTHER)
Admission: EM | Admit: 2013-06-28 | Discharge: 2013-06-28 | Disposition: A | Discharge status: Home / Self Care | Payer: BC Managed Care – PPO | Attending: Emergency Medicine | Admitting: Emergency Medicine

## 2013-06-28 ENCOUNTER — Encounter (HOSPITAL_BASED_OUTPATIENT_CLINIC_OR_DEPARTMENT_OTHER): Payer: Self-pay | Admitting: Emergency Medicine

## 2013-06-28 DIAGNOSIS — F3289 Other specified depressive episodes: Secondary | ICD-10-CM | POA: Insufficient documentation

## 2013-06-28 DIAGNOSIS — J45901 Unspecified asthma with (acute) exacerbation: Secondary | ICD-10-CM | POA: Insufficient documentation

## 2013-06-28 DIAGNOSIS — R209 Unspecified disturbances of skin sensation: Secondary | ICD-10-CM | POA: Insufficient documentation

## 2013-06-28 DIAGNOSIS — Z791 Long term (current) use of non-steroidal anti-inflammatories (NSAID): Secondary | ICD-10-CM | POA: Insufficient documentation

## 2013-06-28 DIAGNOSIS — G4733 Obstructive sleep apnea (adult) (pediatric): Secondary | ICD-10-CM | POA: Insufficient documentation

## 2013-06-28 DIAGNOSIS — IMO0002 Reserved for concepts with insufficient information to code with codable children: Secondary | ICD-10-CM | POA: Insufficient documentation

## 2013-06-28 DIAGNOSIS — Z79899 Other long term (current) drug therapy: Secondary | ICD-10-CM | POA: Insufficient documentation

## 2013-06-28 DIAGNOSIS — G8929 Other chronic pain: Secondary | ICD-10-CM | POA: Insufficient documentation

## 2013-06-28 DIAGNOSIS — R079 Chest pain, unspecified: Secondary | ICD-10-CM | POA: Insufficient documentation

## 2013-06-28 DIAGNOSIS — F329 Major depressive disorder, single episode, unspecified: Secondary | ICD-10-CM | POA: Insufficient documentation

## 2013-06-28 DIAGNOSIS — K219 Gastro-esophageal reflux disease without esophagitis: Secondary | ICD-10-CM | POA: Insufficient documentation

## 2013-06-28 LAB — CBC WITH DIFFERENTIAL/PLATELET
Basophils Absolute: 0 10*3/uL (ref 0.0–0.1)
Basophils Relative: 1 % (ref 0–1)
Eosinophils Absolute: 0.1 10*3/uL (ref 0.0–0.7)
Eosinophils Relative: 2 % (ref 0–5)
HCT: 44.5 % (ref 39.0–52.0)
Hemoglobin: 15.5 g/dL (ref 13.0–17.0)
Lymphocytes Relative: 42 % (ref 12–46)
Lymphs Abs: 2.5 10*3/uL (ref 0.7–4.0)
MCH: 30.9 pg (ref 26.0–34.0)
MCHC: 34.8 g/dL (ref 30.0–36.0)
MCV: 88.8 fL (ref 78.0–100.0)
Monocytes Absolute: 0.9 10*3/uL (ref 0.1–1.0)
Monocytes Relative: 14 % — ABNORMAL HIGH (ref 3–12)
Neutro Abs: 2.5 10*3/uL (ref 1.7–7.7)
Neutrophils Relative %: 42 % — ABNORMAL LOW (ref 43–77)
Platelets: 247 10*3/uL (ref 150–400)
RBC: 5.01 MIL/uL (ref 4.22–5.81)
RDW: 13.6 % (ref 11.5–15.5)
WBC: 6 10*3/uL (ref 4.0–10.5)

## 2013-06-28 LAB — BASIC METABOLIC PANEL
BUN: 11 mg/dL (ref 6–23)
CO2: 21 mEq/L (ref 19–32)
Calcium: 9.2 mg/dL (ref 8.4–10.5)
Chloride: 105 mEq/L (ref 96–112)
Creatinine, Ser: 1.6 mg/dL — ABNORMAL HIGH (ref 0.50–1.35)
GFR calc Af Amer: 61 mL/min — ABNORMAL LOW (ref >90)
GFR calc non Af Amer: 52 mL/min — ABNORMAL LOW (ref >90)
Glucose, Bld: 95 mg/dL (ref 70–99)
Potassium: 4.1 mEq/L (ref 3.7–5.3)
Sodium: 140 mEq/L (ref 137–147)

## 2013-06-28 LAB — TROPONIN I: Troponin I: <0.3 ng/mL (ref <0.30)

## 2013-06-28 MED ORDER — ASPIRIN 325 MG PO TABS
325.0000 mg | ORAL_TABLET | ORAL | Status: AC
  Administered 2013-06-28: 325 mg via ORAL
  Filled 2013-06-28: qty 1

## 2013-06-28 MED ORDER — ASPIRIN 81 MG PO CHEW: 81.0000 mg | CHEWABLE_TABLET | Freq: Every day | ORAL | Status: DC

## 2013-06-28 MED ORDER — NITROGLYCERIN 0.4 MG SL SUBL
0.4000 mg | SUBLINGUAL_TABLET | SUBLINGUAL | Status: DC | PRN
  Administered 2013-06-28 (×2): 0.4 mg via SUBLINGUAL
  Filled 2013-06-28: qty 1
  Filled 2013-06-28: qty 25

## 2013-06-28 MED ORDER — ACETAMINOPHEN 325 MG PO TABS
650.0000 mg | ORAL_TABLET | Freq: Once | ORAL | Status: DC
  Filled 2013-06-28: qty 2

## 2013-06-28 NOTE — Discharge Instructions (Signed)
Chest Pain (Nonspecific) °It is often hard to give a specific diagnosis for the cause of chest pain. There is always a chance that your pain could be related to something serious, such as a heart attack or a blood clot in the lungs. You need to follow up with your caregiver for further evaluation. °CAUSES  °· Heartburn. °· Pneumonia or bronchitis. °· Anxiety or stress. °· Inflammation around your heart (pericarditis) or lung (pleuritis or pleurisy). °· A blood clot in the lung. °· A collapsed lung (pneumothorax). It can develop suddenly on its own (spontaneous pneumothorax) or from injury (trauma) to the chest. °· Shingles infection (herpes zoster virus). °The chest wall is composed of bones, muscles, and cartilage. Any of these can be the source of the pain. °· The bones can be bruised by injury. °· The muscles or cartilage can be strained by coughing or overwork. °· The cartilage can be affected by inflammation and become sore (costochondritis). °DIAGNOSIS  °Lab tests or other studies, such as X-rays, electrocardiography, stress testing, or cardiac imaging, may be needed to find the cause of your pain.  °TREATMENT  °· Treatment depends on what may be causing your chest pain. Treatment may include: °· Acid blockers for heartburn. °· Anti-inflammatory medicine. °· Pain medicine for inflammatory conditions. °· Antibiotics if an infection is present. °· You may be advised to change lifestyle habits. This includes stopping smoking and avoiding alcohol, caffeine, and chocolate. °· You may be advised to keep your head raised (elevated) when sleeping. This reduces the chance of acid going backward from your stomach into your esophagus. °· Most of the time, nonspecific chest pain will improve within 2 to 3 days with rest and mild pain medicine. °HOME CARE INSTRUCTIONS  °· If antibiotics were prescribed, take your antibiotics as directed. Finish them even if you start to feel better. °· For the next few days, avoid physical  activities that bring on chest pain. Continue physical activities as directed. °· Do not smoke. °· Avoid drinking alcohol. °· Only take over-the-counter or prescription medicine for pain, discomfort, or fever as directed by your caregiver. °· Follow your caregiver's suggestions for further testing if your chest pain does not go away. °· Keep any follow-up appointments you made. If you do not go to an appointment, you could develop lasting (chronic) problems with pain. If there is any problem keeping an appointment, you must call to reschedule. °SEEK MEDICAL CARE IF:  °· You think you are having problems from the medicine you are taking. Read your medicine instructions carefully. °· Your chest pain does not go away, even after treatment. °· You develop a rash with blisters on your chest. °SEEK IMMEDIATE MEDICAL CARE IF:  °· You have increased chest pain or pain that spreads to your arm, neck, jaw, back, or abdomen. °· You develop shortness of breath, an increasing cough, or you are coughing up blood. °· You have severe back or abdominal pain, feel nauseous, or vomit. °· You develop severe weakness, fainting, or chills. °· You have a fever. °THIS IS AN EMERGENCY. Do not wait to see if the pain will go away. Get medical help at once. Call your local emergency services (911 in [place] Do not drive yourself to the hospital. °MAKE SURE YOU:  °· Understand these instructions. °· Will watch your condition. °· Will get help right away if you are not doing well or get worse. °Document Released: 12/01/2004 Document Revised: 05/16/2011 Document Reviewed: 09/27/2007 °[employer]® Patient Information ©2014 [employer]   [employer]. ° °

## 2013-06-28 NOTE — ED Notes (Signed)
Pt reports intermittent left-sided chest pain that began this a.m. - pt dismissed chest pain as reflux until pain began to throbbing and left arm became numb - pt admits to increased shortness of breath however admits to hx of asthma. Pt denies any swelling to lower extremities - pt admits to cough w/ white sputum, denies fever.

## 2013-06-28 NOTE — ED Notes (Signed)
Sharp throbbing left sided chest pain today. Numbness in his left arm. Hx of asthma. States he feel slightly sob.

## 2013-06-28 NOTE — ED Notes (Signed)
Pt ambulating independently w/ steady gait on d/c in no acute distress, A&Ox4. D/c instructions reviewed w/ pt, pt denies any further questions or concerns at present. Rx given x1

## 2013-06-28 NOTE — ED Provider Notes (Signed)
CSN: [id number]     Arrival date & time 06/28/13  1946 History  This chart was scribed for [name], MD by Elveria Risingimelie Horne, ED scribe.  This patient was seen in room MH07/MH07 and the patient's care was started at 7:57 PM.  Chief Complaint  Patient presents with  . Chest Pain      HPI HPI Comments: Darryl Morton is a 43 y.o. male with history of asthma who presents to the Emergency Department complaining of intermittent left sided chest pain, onset his morning and left arm numbness. Patient initially describes sharp chest pain that is now a throbbing sensation. Patient has taken ibuprofen today and reports mild relief. Patient has history of asthma and reports shortness of breath today. Patient denies aggravating or alleviating factors for his chest pain because he has not been sitting for most of the day, but reports feeling winded when climbing stairs. However patient admits that this has been is no unusual given that he is out of shape.   Patient denies leg swelling, cough or fever. Patient denies family history of heart disease. Patient denies alcohol or drug use.    Past Medical History  Diagnosis Date  . Asthma   . Depression   . Chronic headaches   . Chronic rhinitis 06/30/2011  . GERD (gastroesophageal reflux disease) 06/30/2011  . OSA (obstructive sleep apnea) 06/30/2011    PSG 08/05/11>>AHI 32.8, SpO2 82%. CPAP 9 cm H2O>> 9 (mostly central hypopneas), +R, +S    Past Surgical History  Procedure Laterality Date  . Appendectomy    . Achilles tendon repair      right   Family History  Problem Relation Age of Onset  . Asthma      father side  . Allergies      father side   History  Substance Use Topics  . Smoking status: Never Smoker   . Smokeless tobacco: Not on file     Comment: exposed to second hand smoke  . Alcohol Use: No    Review of Systems A complete 10 system review of systems was obtained and all systems are negative except as noted in the HPI and  PMH.     Allergies  Review of patient's allergies indicates no known allergies.  Home Medications   Prior to Admission medications   Medication Sig Start Date End Date Taking? Authorizing Provider  albuterol (PROVENTIL HFA;VENTOLIN HFA) 108 (90 BASE) MCG/ACT inhaler Inhale 2 puffs into the lungs every 6 (six) hours as needed.    Historical Provider, MD  albuterol (PROVENTIL HFA;VENTOLIN HFA) 108 (90 BASE) MCG/ACT inhaler Inhale 2 puffs into the lungs every 4 (four) hours as needed for wheezing. 02/07/12   Suzi RootsKevin E Steinl, MD  budesonide-formoterol [facility](SYMBICORT) 160-4.5 MCG/ACT inhaler Inhale 2 puffs into the lungs 2 (two) times daily. 08/04/11 08/03/12  Coralyn HellingVineet Sood, MD  fluticasone (FLONASE) 50 MCG/ACT nasal spray Place 2 sprays into the nose daily. 06/30/11 06/29/12  Coralyn HellingVineet Sood, MD  HYDROcodone-acetaminophen (VICODIN) 5-500 MG per tablet Take 1-2 tablets by mouth every 6 (six) hours as needed for pain. 01/10/12   Gwyneth SproutWhitney Plunkett, MD  loratadine (CLARITIN) 10 MG tablet Take 10 mg by mouth daily.    Historical Provider, MD  montelukast (SINGULAIR) 10 MG tablet Once a day 05/06/11   Historical Provider, MD  naproxen (NAPROSYN) 500 MG tablet Take 1 tablet (500 mg total) by mouth 2 (two) times daily. 01/10/12   Gwyneth SproutWhitney Plunkett, MD  omeprazole (PRILOSEC) 20 MG capsule Take  1 capsule (20 mg total) by mouth daily. 06/30/11 06/29/12  Coralyn HellingVineet Sood, MD  predniSONE (DELTASONE) 20 MG tablet Take 3 tablets (60 mg total) by mouth daily. 02/07/12   Suzi RootsKevin E Steinl, MD  ranitidine (ZANTAC) 75 MG tablet Take 75 mg by mouth as needed.    Historical Provider, MD  venlafaxine XR (EFFEXOR-XR) 150 MG 24 hr capsule Once a day 05/06/11   Historical Provider, MD   Triage Vitals: BP 135/78  Pulse 74  Temp(Src) 98 F (36.7 C) (Oral)  Resp 16  Ht 6' 9.5" (2.07 m)  Wt 360 lb (163.295 kg)  BMI 38.11 kg/m2  SpO2 94% Physical Exam  Nursing note and vitals reviewed. Constitutional: He is oriented to person, place, and time. He  appears well-developed and well-nourished.  HENT:  Head: Normocephalic and atraumatic.  Eyes: EOM are normal. Pupils are equal, round, and reactive to light.  Neck: Normal range of motion. Neck supple.  Cardiovascular: Normal rate, normal heart sounds and intact distal pulses.   Pulmonary/Chest: Effort normal and breath sounds normal.  Abdominal: Bowel sounds are normal. He exhibits no distension. There is no tenderness.  Musculoskeletal: Normal range of motion. He exhibits no edema and no tenderness.  Neurological: He is alert and oriented to person, place, and time. He has normal strength. No cranial nerve deficit or sensory deficit.  Skin: Skin is warm and dry. No rash noted.  Psychiatric: He has a normal mood and affect.    ED Course  Procedures (including critical care time) DIAGNOSTIC STUDIES: Oxygen Saturation is 94% on room air, adequate by my interpretation.    COORDINATION OF CARE: 8:00 PM- Discussed treatment plan with patient at bedside and patient agreed to plan.     Labs Review Labs Reviewed  CBC WITH DIFFERENTIAL - Abnormal; Notable for the following:    Neutrophils Relative % 42 (*)    Monocytes Relative 14 (*)    All other components within normal limits  BASIC METABOLIC PANEL - Abnormal; Notable for the following:    Creatinine, Ser 1.60 (*)    GFR calc non Af Amer 52 (*)    GFR calc Af Amer 61 (*)    All other components within normal limits  TROPONIN I    Imaging Review Dg Chest 2 View  06/28/2013   CLINICAL DATA:  Left-sided chest pain and left arm numbness. Shortness of breath.  EXAM: CHEST  2 VIEW  COMPARISON:  05/20/2011  FINDINGS: The cardiomediastinal silhouette is within normal limits. The lungs are well inflated and clear. There is no evidence of pleural effusion or pneumothorax. No acute osseous abnormality is identified.  IMPRESSION: No active cardiopulmonary disease.   Electronically Signed   By: Sebastian AcheAllen  Grady   On: 06/28/2013 20:54     EKG  Interpretation   Date/Time:  Friday June 28 2013 19:53:54 EDT Ventricular Rate:  80 PR Interval:  172 QRS Duration: 90 QT Interval:  376 QTC Calculation: 433 R Axis:   137 Text Interpretation:  Normal sinus rhythm Right axis deviation Pulmonary  disease pattern Right ventricular hypertrophy Abnormal ECG No old tracing  to compare Confirmed by [facility] LPHELDON  MD, Leonette MostHARLES [phone] on 06/28/2013 7:57:24  PM      MDM   Final diagnoses:  Chest pain    Pt pain free after NTG. Labs and imaging reviewed. Discussed with patient unlikely he has had MI given neg labs and timing of his pain. Discussed possibility of CAD as a cause of his symptoms  and offered inpatient evaluation however he would like to go home. He has PCP follow up. Advised to call EMS for any return of symptoms or for any other concerns.  I personally performed the services described in this documentation, which was scribed in my presence. The recorded information has been reviewed and is accurate.      Charles B. Bernette Mayers, MD 06/28/13 2239

## 2014-08-14 ENCOUNTER — Ambulatory Visit (INDEPENDENT_AMBULATORY_CARE_PROVIDER_SITE_OTHER): Payer: BLUE CROSS/BLUE SHIELD | Admitting: Internal Medicine

## 2014-08-14 ENCOUNTER — Encounter: Payer: Self-pay | Admitting: Internal Medicine

## 2014-08-14 VITALS — BP 124/76 | HR 81 | Ht >= 80 in | Wt 353.0 lb

## 2014-08-14 DIAGNOSIS — R05 Cough: Secondary | ICD-10-CM | POA: Diagnosis not present

## 2014-08-14 DIAGNOSIS — R058 Other specified cough: Secondary | ICD-10-CM

## 2014-08-14 MED ORDER — FAMOTIDINE 20 MG PO TABS: ORAL_TABLET | ORAL | Status: DC

## 2014-08-14 MED ORDER — TRAMADOL HCL 50 MG PO TABS: ORAL_TABLET | ORAL | Status: DC

## 2014-08-14 MED ORDER — ALBUTEROL SULFATE HFA 108 (90 BASE) MCG/ACT IN AERS: 2.0000 | INHALATION_SPRAY | RESPIRATORY_TRACT | Status: DC | PRN

## 2014-08-14 MED ORDER — PREDNISONE 10 MG PO TABS: ORAL_TABLET | ORAL | Status: DC

## 2014-08-14 MED ORDER — MONTELUKAST SODIUM 10 MG PO TABS: ORAL_TABLET | ORAL | Status: DC

## 2014-08-14 MED ORDER — PANTOPRAZOLE SODIUM 40 MG PO TBEC: 40.0000 mg | DELAYED_RELEASE_TABLET | Freq: Every day | ORAL | Status: DC

## 2014-08-14 NOTE — Progress Notes (Signed)
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 Pulmonary Consult    Self referral. Pt seen here in the past by Dr Craige Cotta. He states cough has been worse for the past 4-5 wks. Cough is non prod. He has been having PND. Prednisone prescribed recently helped while taking but cough started again when finished taper.   Unset was acute with nasal congestion/ sore throat/ dry cough >>  To [facility] x 2 since onset of illness rx pred/ amox > some better, flared when came off different UC rx pred x 5 days / helps the nasal congestion some but no the cough  Dry cough better while sleeping and does not wake him in am  No obvious day to day or daytime variabilty or assoc  cp or chest tightness, subjective wheeze  Or overt   hb symptoms. No unusual exp hx or h/o childhood pna/ asthma or knowledge of premature birth.  Sleeping ok without nocturnal  or early am exacerbation  of respiratory  c/o's or need for noct saba. Also denies any obvious fluctuation of symptoms with weather or environmental changes or other aggravating or alleviating factors except as outlined above   Current Medications, Allergies, Complete Past Medical History, Past Surgical History, Family History, and Social History were reviewed in [employer] record.              Review of Systems  Constitutional: Negative for fever, chills, activity change,  appetite change and unexpected weight change.  HENT: Positive for congestion and postnasal drip. Negative for dental problem, rhinorrhea, sneezing, sore throat, trouble swallowing and voice change.   Eyes: Negative for visual disturbance.  Respiratory: Positive for cough. Negative for choking and shortness of breath.   Cardiovascular: Negative for chest pain and leg swelling.  Gastrointestinal: Negative for nausea, vomiting and abdominal pain.  Genitourinary: Negative for difficulty urinating.  Musculoskeletal: Negative for arthralgias.  Skin: Negative for rash.  Psychiatric/Behavioral: Negative for behavioral problems and confusion.       Objective:   Physical Exam  amb bm with very harsh upper airway coughing fits    Wt Readings from Last 3 Encounters:  08/14/14 353 lb (160.12 kg)  06/28/13 360 lb (163.295 kg)  02/07/12 320 lb (145.151 kg)    Vital signs reviewed   HEENT: nl dentition, turbinates, and orophanx. Nl external ear canals without cough reflex   NECK :  without JVD/Nodes/TM/ nl carotid upstrokes bilaterally   LUNGS: no acc muscle use, clear to A and P bilaterally without cough on insp or exp maneuvers   CV:  RRR  no s3 or murmur or increase in P2, no edema   ABD:  soft and nontender with nl excursion in the supine position. No bruits or organomegaly, bowel sounds nl  MS:  warm without deformities, calf tenderness, cyanosis or clubbing  SKIN: warm and dry without lesions    NEURO:  alert, approp, no deficits    [facility]r [facility]" > not avail in care everywhere "ok" per pt      Assessment & Plan:

## 2014-08-14 NOTE — Patient Instructions (Signed)
Start singulair 10 mg daily and stop symbicort for now  Only use your albuterol(proair) as a rescue medication to be used if you can't catch your breath by resting or doing a relaxed purse lip breathing pattern.  - The less you use it, the better it will work when you need it. - Ok to use up to 2 puffs  every 4 hours if you must but call for immediate appointment if use goes up over your usual need - Don't leave home without it !!  (think of it like the spare tire for your car)   The key to effective treatment for your cough is eliminating the non-stop cycle of cough you're stuck in long enough to let your airway heal completely and then see if there is anything still making you cough once you stop the cough suppression, but this should take no more than 5 days to figure out  First take delsym two tsp every 12 hours and supplement if needed with  tramadol 50 mg up to 2 every 4 hours to suppress the urge to cough at all or even clear your throat. Swallowing water or using ice chips/non mint and menthol containing candies (such as lifesavers or sugarless jolly ranchers) are also effective.  You should rest your voice and avoid activities that you know make you cough.  Once you have eliminated the cough for 3 straight days try reducing the tramadol first,  then the delsym as tolerated.    Prednisone 10 mg take  4 each am x 2 days,   2 each am x 2 days,  1 each am x 2 days and stop (this is to eliminate allergies and inflammation from coughing)  Protonix (pantoprazole) Take 30-60 min before first meal of the day and Pepcid 20 mg one bedtime plus chlorpheniramine 4 mg x 2 at bedtime (both available over the counter)  until cough is completely gone for at least a week without the need for cough suppression  GERD (REFLUX)  is an extremely common cause of respiratory symptoms, many times with no significant heartburn at all.    It can be treated with medication, but also with lifestyle changes including  avoidance of late meals, excessive alcohol, smoking cessation, and avoid fatty foods, chocolate, peppermint, colas, red wine, and acidic juices such as orange juice.  NO MINT OR MENTHOL PRODUCTS SO NO COUGH DROPS  USE HARD CANDY INSTEAD (jolley ranchers or Stover's or Lifesavers (all available in sugarless versions) NO OIL BASED VITAMINS - use powdered substitutes.   Please schedule a follow up office visit in2 weeks, sooner if needed

## 2014-08-15 ENCOUNTER — Encounter: Payer: Self-pay | Admitting: Internal Medicine

## 2014-08-15 NOTE — Assessment & Plan Note (Signed)
The most common causes of chronic cough in immunocompetent adults include the following: upper airway cough syndrome (UACS), previously referred to as postnasal drip syndrome (PNDS), which is caused by variety of rhinosinus conditions; (2) asthma; (3) GERD; (4) chronic bronchitis from cigarette smoking or other inhaled environmental irritants; (5) nonasthmatic eosinophilic bronchitis; and (6) bronchiectasis.   These conditions, singly or in combination, have accounted for up to 94% of the causes of chronic cough in prospective studies.   Other conditions have constituted no >6% of the causes in prospective studies These have included bronchogenic carcinoma, chronic interstitial pneumonia, sarcoidosis, left ventricular failure, ACEI-induced cough, and aspiration from a condition associated with pharyngeal dysfunction.    Chronic cough is often simultaneously caused by more than one condition. A single cause has been found from 38 to 82% of the time, multiple causes from 18 to 62%. Multiply caused cough has been the result of three diseases up to 42% of the time.       Based on hx and exam, this is most likely:  Classic Upper airway cough syndrome, so named because it's frequently impossible to sort out how much is  CR/sinusitis with freq throat clearing (which can be related to primary GERD)   vs  causing  secondary (" extra esophageal")  GERD from wide swings in gastric pressure that occur with throat clearing, often  promoting self use of mint and menthol lozenges that reduce the lower esophageal sphincter tone and exacerbate the problem further in a cyclical fashion.   These are the same pts (now being labeled as having "irritable larynx syndrome" by some cough centers) who not infrequently have a history of having failed to tolerate ace inhibitors,  dry powder inhalers(or any form of ICS when the cough is this bad)  or biphosphonates or report having atypical reflux symptoms that don't respond to  standard doses of PPI , and are easily confused as having aecopd or asthma flares by even experienced allergists/ pulmonologists.   The first step is to maximize acid suppression and eliminate cyclical coughing and all inhalers in favor of singulair then regroup  Persists with sinus ct the next step   See instructions for specific recommendations which were reviewed directly with the patient who was given a copy with highlighter outlining the key components.

## 2014-08-28 ENCOUNTER — Ambulatory Visit: Payer: BLUE CROSS/BLUE SHIELD | Admitting: Internal Medicine

## 2014-09-04 ENCOUNTER — Encounter: Payer: Self-pay | Admitting: Internal Medicine

## 2014-09-04 ENCOUNTER — Other Ambulatory Visit (INDEPENDENT_AMBULATORY_CARE_PROVIDER_SITE_OTHER): Payer: BLUE CROSS/BLUE SHIELD

## 2014-09-04 ENCOUNTER — Ambulatory Visit (INDEPENDENT_AMBULATORY_CARE_PROVIDER_SITE_OTHER): Payer: BLUE CROSS/BLUE SHIELD | Admitting: Internal Medicine

## 2014-09-04 VITALS — BP 118/84 | HR 83 | Ht >= 80 in | Wt 348.8 lb

## 2014-09-04 DIAGNOSIS — R05 Cough: Secondary | ICD-10-CM

## 2014-09-04 DIAGNOSIS — E669 Obesity, unspecified: Secondary | ICD-10-CM | POA: Diagnosis not present

## 2014-09-04 DIAGNOSIS — R058 Other specified cough: Secondary | ICD-10-CM

## 2014-09-04 LAB — CBC WITH DIFFERENTIAL/PLATELET
Basophils Absolute: 0 10*3/uL (ref 0.0–0.1)
Basophils Relative: 0.8 % (ref 0.0–3.0)
Eosinophils Absolute: 0 10*3/uL (ref 0.0–0.7)
Eosinophils Relative: 1.1 % (ref 0.0–5.0)
HCT: 47.1 % (ref 39.0–52.0)
Hemoglobin: 16.2 g/dL (ref 13.0–17.0)
Lymphocytes Relative: 40.9 % (ref 12.0–46.0)
Lymphs Abs: 1.9 10*3/uL (ref 0.7–4.0)
MCHC: 34.3 g/dL (ref 30.0–36.0)
MCV: 89.7 fl (ref 78.0–100.0)
Monocytes Absolute: 0.7 10*3/uL (ref 0.1–1.0)
Monocytes Relative: 15.1 % — ABNORMAL HIGH (ref 3.0–12.0)
Neutro Abs: 1.9 10*3/uL (ref 1.4–7.7)
Neutrophils Relative %: 42.1 % — ABNORMAL LOW (ref 43.0–77.0)
Platelets: 264 10*3/uL (ref 150.0–400.0)
RBC: 5.24 Mil/uL (ref 4.22–5.81)
RDW: 13.4 % (ref 11.5–15.5)
WBC: 4.6 10*3/uL (ref 4.0–10.5)

## 2014-09-04 MED ORDER — PREDNISONE 10 MG PO TABS: ORAL_TABLET | ORAL | Status: DC

## 2014-09-04 MED ORDER — BUDESONIDE-FORMOTEROL FUMARATE 80-4.5 MCG/ACT IN AERO: INHALATION_SPRAY | RESPIRATORY_TRACT | Status: DC

## 2014-09-04 NOTE — Progress Notes (Signed)
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 Pulmonary Consult    Self referral. Pt seen here in the past by Dr Craige Cotta. He states cough has been worse for the past 4-5 wks. Cough is non prod. He has been having PND. Prednisone prescribed recently helped while taking but cough started again when finished taper.   Onset was acute with nasal congestion/ sore throat/ dry cough >>  To [facility] x 2 since onset of illness rx pred/ amox > some better, flared when came off different UC rx pred x 5 days / helps the nasal congestion some but no the cough  Dry cough better while sleeping and does not wake him in am rec Start singulair 10 mg daily and stop symbicort for now Only use your albuterol(proair)  First take delsym two tsp every 12 hours and supplement if needed with  tramadol 50 mg up to 2 every 4 hours  Once you have eliminated the cough for 3 straight days try reducing the tramadol first,  then the delsym as tolerated.   Prednisone 10 mg take  4 each am x 2 days,   2 each am x 2 days,  1 each am x 2 days and stop (this is to eliminate allergies and inflammation from coughing) Protonix (pantoprazole) Take 30-60 min before first meal of the day and Pepcid 20 mg one bedtime plus chlorpheniramine 4 mg x 2 at bedtime (both available over the counter)   until cough is completely gone for at least a week without the need for cough suppression GERD diet    09/04/2014 f/u ov/Darryl Morton re: cough x ever since in [place] x 1998  Chief Complaint  Patient presents with  . Follow-up    Cough is about the same. Tramadol had helped some until he ran out. His BP has been elevated.     Prednisone always helps a lot better than anything else /noct cough much better on singulair   No obvious day to day or daytime variabilty or assoc sob  or cp or chest tightness, subjective wheeze overt sinus or hb symptoms. No unusual exp hx or h/o childhood pna/ asthma or knowledge of premature birth.  Sleeping ok without nocturnal  or early am exacerbation  of respiratory  c/o's or need for noct saba. Also denies any obvious fluctuation of symptoms with weather or environmental changes or other aggravating or alleviating factors except as outlined above   Current Medications, Allergies, Complete Past Medical History, Past Surgical History, Family History, and Social History were reviewed in [employer] record.  ROS  The following are not active complaints unless bolded sore throat, dysphagia, dental problems, itching, sneezing,  nasal congestion or excess/ purulent secretions, ear ache,  fever, chills, sweats, unintended wt loss, pleuritic or exertional cp, hemoptysis,  orthopnea pnd or leg swelling, presyncope, palpitations, abdominal pain, anorexia, nausea, vomiting, diarrhea  or change in bowel or urinary habits, change in stools or urine, dysuria,hematuria,  rash, arthralgias, visual complaints, headache, numbness weakness or ataxia or problems with walking or coordination,  change in mood/affect or memory.         Objective:   Physical Exam  amb bm with Mild  upper airway coughing fits   09/04/2014        349   Wt Readings from Last 3 Encounters:  08/14/14 353 lb (160.12 kg)  06/28/13 360 lb (163.295 kg)  02/07/12 320 lb (145.151 kg)    Vital  signs reviewed   HEENT: nl dentition, turbinates, and orophanx. Nl external ear canals without cough reflex   NECK :  without JVD/Nodes/TM/ nl carotid upstrokes bilaterally   LUNGS: no acc muscle use, clear to A and P bilaterally with  cough on insp but not consistent      CV:  RRR  no s3 or murmur or increase in P2, no edema   ABD:  soft and nontender with nl excursion in the supine position. No bruits or organomegaly, bowel sounds nl  MS:  warm without deformities, calf tenderness, cyanosis or clubbing  SKIN: warm and dry without lesions    NEURO:  alert, approp, no deficits    [facility]r at [facility]" > not avail in care everywhere "ok" per pt   Labs ordered : allergy profile      Assessment & Plan:

## 2014-09-04 NOTE — Patient Instructions (Addendum)
Please remember to go to the lab  department downstairs for your tests - we will call you with the results when they are available.  Prednisone 10 mg take  4 each am x 2 days,   2 each am x 2 days,  1 each am x 2 days and stop   Symbicort 80 Take 2 puffs first thing in am and then another 2 puffs about 12 hours later.   Work on inhaler technique:  relax and gently blow all the way out then take a nice smooth deep breath back in, triggering the inhaler at same time you start breathing in.  Hold for up to 5 seconds if you can. Blow out thru nose  Rinse and gargle with water when done    GERD (REFLUX)  is an extremely common cause of respiratory symptoms just like yours , many times with no obvious heartburn at all.    It can be treated with medication, but also with lifestyle changes including elevation of the head of your bed (ideally with 6 inch  bed blocks),  Smoking cessation, avoidance of late meals, excessive alcohol, and avoid fatty foods, chocolate, peppermint, colas, red wine, and acidic juices such as orange juice.  NO MINT OR MENTHOL PRODUCTS SO NO COUGH DROPS  USE SUGARLESS CANDY INSTEAD (Jolley ranchers or Stover's or Life Savers) or even ice chips will also do - the key is to swallow to prevent all throat clearing. NO OIL BASED VITAMINS - use powdered substitutes.   Please schedule a follow up office visit in 2 weeks, sooner if needed  - late add: sinus ct on return in not better

## 2014-09-05 LAB — ALLERGY FULL PROFILE
Allergen, D pternoyssinus,d7: <0.1 kU/L
Allergen,Goose feathers, e70: <0.1 kU/L
Alternaria Alternata: <0.1 kU/L
Aspergillus fumigatus, m3: <0.1 kU/L
Bahia Grass: <0.1 kU/L
Bermuda Grass: <0.1 kU/L
Box Elder IgE: <0.1 kU/L
Candida Albicans: <0.1 kU/L
Cat Dander: <0.1 kU/L
Common Ragweed: <0.1 kU/L
Curvularia lunata: <0.1 kU/L
D. farinae: <0.1 kU/L
Dog Dander: <0.1 kU/L
Elm IgE: <0.1 kU/L
Fescue: <0.1 kU/L
G005 Rye, Perennial: <0.1 kU/L
G009 Red Top: <0.1 kU/L
Goldenrod: <0.1 kU/L
Helminthosporium halodes: <0.1 kU/L
House Dust Hollister: <0.1 kU/L
IgE (Immunoglobulin E), Serum: 16 kU/L (ref <115)
Lamb's Quarters: <0.1 kU/L
Oak: <0.1 kU/L
Plantain: <0.1 kU/L
Stemphylium Botryosum: <0.1 kU/L
Sycamore Tree: <0.1 kU/L
Timothy Grass: <0.1 kU/L

## 2014-09-07 ENCOUNTER — Encounter: Payer: Self-pay | Admitting: Internal Medicine

## 2014-09-07 DIAGNOSIS — E669 Obesity, unspecified: Secondary | ICD-10-CM | POA: Insufficient documentation

## 2014-09-07 NOTE — Assessment & Plan Note (Addendum)
-   D/c symbicort 08/15/2014 - rx singulair 08/15/2014  - allergy profile 09/04/14  IgE 16, neg RAST, neg Eos    Response reported to prednisone narrows the ddx to cough variant asthma vs eos rhinitis with pnds vs eos bronchitis  Already on singlair with neg Resp RAST so next step add low dose ics in the form of symbicort 80 2bid  The proper method of use, as well as anticipated side effects, of a metered-dose inhaler are discussed and demonstrated to the patient. Improved effectiveness after extensive coaching during this visit to a level of approximately  75%  I had an extended discussion with the patient reviewing all relevant studies completed to date and  lasting 15 to 20 minutes of a 25 minute visit    1) The standardized cough guidelines published in Chest by [employer] [employer] in 2006 are still the best available and consist of a multiple step process (up to 12!) , not a single office visit,  and are intended  to address this problem logically,  with an alogrithm dependent on response to empiric treatment at  each progressive step  to determine a specific diagnosis with  minimal addtional testing needed. Therefore if adherence is an issue or can't be accurately verified,  it's very unlikely the standard evaluation and treatment will be successful here.    Furthermore, response to therapy (other than acute cough suppression, which should only be used short term with avoidance of narcotic containing cough syrups if possible), can be a gradual process for which the patient may perceive immediate benefit.  Unlike going to an eye doctor where the best perscription is almost always the first one and is immediately effective, this is almost never the case in the management of chronic cough syndromes. Therefore the patient needs to commit up front to consistently adhere to recommendations  for up to 6 weeks of therapy directed at the likely underlying problem(s) before the response can be reasonably  evaluated.    2) Each maintenance medication was reviewed in detail including most importantly the difference between maintenance and prns and under what circumstances the prns are to be triggered using an action plan format that is not reflected in the computer generated alphabetically organized AVS.    Please see instructions for details which were reviewed in writing and the patient given a copy highlighting the part that I personally wrote and discussed at today's ov.

## 2014-09-09 NOTE — Progress Notes (Signed)
Quick Note:  LMTCB ______ 

## 2014-09-10 ENCOUNTER — Telehealth: Payer: Self-pay | Admitting: Internal Medicine

## 2014-09-10 NOTE — Progress Notes (Signed)
Quick Note:  Called and spoke to pt. Informed pt of the lab results per MW. Pt verbalized understanding and denied any further questions or concerns at this time. ______

## 2014-09-10 NOTE — Telephone Encounter (Signed)
Called and spoke to pt. Informed pt of the lab results per MW. Pt verbalized understanding and denied any further questions or concerns at this time.    Notes Recorded by Nyoka CowdenMichael B Wert, MD on 09/05/2014 at 5:36 PM Call patient : Studies are unremarkable, no resp allergies identified

## 2014-09-18 ENCOUNTER — Ambulatory Visit (INDEPENDENT_AMBULATORY_CARE_PROVIDER_SITE_OTHER): Payer: BLUE CROSS/BLUE SHIELD | Admitting: Internal Medicine

## 2014-09-18 ENCOUNTER — Encounter: Payer: Self-pay | Admitting: Internal Medicine

## 2014-09-18 VITALS — BP 110/80 | HR 68 | Ht >= 80 in | Wt 349.0 lb

## 2014-09-18 DIAGNOSIS — R05 Cough: Secondary | ICD-10-CM

## 2014-09-18 DIAGNOSIS — E669 Obesity, unspecified: Secondary | ICD-10-CM | POA: Diagnosis not present

## 2014-09-18 DIAGNOSIS — R058 Other specified cough: Secondary | ICD-10-CM

## 2014-09-18 NOTE — Patient Instructions (Addendum)
Please see patient coordinator before you leave today  to schedule sinus CT and we will call you with results  Try off symbicort to see what if any difference this make but worse start back on symbicort right away  - while off the symbicort always carry the rescue inhaler with you wherever you go   Please schedule a follow up visit in 3 months but call sooner if needed

## 2014-09-18 NOTE — Progress Notes (Signed)
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 Pulmonary Consult    Self referral. Pt seen here in the past by Dr Craige Cotta. He states cough has been worse for the past 4-5 wks. Cough is non prod. He has been having PND. Prednisone prescribed recently helped while taking but cough started again when finished taper.   Onset was acute with nasal congestion/ sore throat/ dry cough >>  To [facility] x 2 since onset of illness rx pred/ amox > some better, flared when came off different UC rx pred x 5 days / helps the nasal congestion some but no the cough  Dry cough better while sleeping and does not wake him in am rec Start singulair 10 mg daily and stop symbicort for now Only use your albuterol(proair)  First take delsym two tsp every 12 hours and supplement if needed with  tramadol 50 mg up to 2 every 4 hours  Once you have eliminated the cough for 3 straight days try reducing the tramadol first,  then the delsym as tolerated.   Prednisone 10 mg take  4 each am x 2 days,   2 each am x 2 days,  1 each am x 2 days and stop (this is to eliminate allergies and inflammation from coughing) Protonix (pantoprazole) Take 30-60 min before first meal of the day and Pepcid 20 mg one bedtime plus chlorpheniramine 4 mg x 2 at bedtime (both  available over the counter)  until cough is completely gone for at least a week without the need for cough suppression GERD diet    09/04/2014 f/u ov/Gwendlyn Hanback re: cough x ever since in [place] x 1998  Chief Complaint  Patient presents with  . Follow-up    Cough is about the same. Tramadol had helped some until he ran out. His BP has been elevated.   Prednisone always helps a lot better than anything else /noct cough much better on singulair  rec  Prednisone 10 mg take  4 each am x 2 days,   2 each am x 2 days,  1 each am x 2 days and stop  Symbicort 80 Take 2 puffs first thing in am and then another 2 puffs about 12 hours later.  Work on inhaler technique:    GERD diet         09/18/2014 f/u ov/Mackenzey Crownover re: cough x 1998  C/w uacs  Chief Complaint  Patient presents with  . Follow-up    Cough is much improved. Not having to take delsym often and has not needed albuterol.    Sleeping s cough/ mostly occurs with voice use now but on phone all day   No obvious other patterns day to day or  daytime variability or assoc sob  or cp or chest tightness, subjective wheeze or overt sinus or hb symptoms. No unusual exp hx or h/o childhood pna/ asthma or knowledge of premature birth.  Sleeping ok without nocturnal  or early am exacerbation  of respiratory  c/o's or need for noct saba. Also denies any obvious fluctuation of symptoms with weather or environmental changes or other aggravating or alleviating factors except as outlined above   Current Medications, Allergies, Complete Past Medical History, Past Surgical History, Family History, and Social History were reviewed in [facility] electronic medical record.  ROS  The following are not active complaints unless bolded sore throat, dysphagia, dental problems, itching, sneezing,  nasal congestion or excess/ purulent secretions, ear ache,   fever, chills, sweats, unintended wt loss, classically pleuritic or exertional cp, hemoptysis,  orthopnea pnd or leg  swelling, presyncope, palpitations, abdominal pain, anorexia, nausea, vomiting, diarrhea  or change in bowel or bladder habits, change in stools or urine, dysuria,hematuria,  rash, arthralgias, visual complaints, headache, numbness, weakness or ataxia or problems with walking or coordination,  change in mood/affect or memory.                      Objective:   Physical Exam  amb bm  Rare cough during interview, seems worse with talking   09/04/2014        349  >  09/18/2014   349  Wt Readings from Last 3 Encounters:  08/14/14 353 lb (160.12 kg)  06/28/13 360 lb (163.295 kg)  02/07/12 320 lb (145.151 kg)    Vital signs reviewed   HEENT: nl dentition, turbinates, and orophanx. Nl external ear canals without cough reflex   NECK :  without JVD/Nodes/TM/ nl carotid upstrokes bilaterally   LUNGS: no acc muscle use, clear to A and P bilaterally with  Urge to  cough on insp     CV:  RRR  no s3 or murmur or increase in P2, no edema   ABD:  soft and nontender with nl excursion in the supine position. No bruits or organomegaly, bowel sounds nl  MS:  warm without deformities, calf tenderness, cyanosis or clubbing  SKIN: warm and dry without lesions    NEURO:  alert, approp, no deficits    [facility]r at [facility]" > not avail in care everywhere "ok" per pt         Assessment & Plan:

## 2014-09-21 ENCOUNTER — Encounter: Payer: Self-pay | Admitting: Internal Medicine

## 2014-09-21 NOTE — Assessment & Plan Note (Signed)
Body mass index is 36.94 kg/(m^2).  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 TSH recently?

## 2014-09-21 NOTE — Assessment & Plan Note (Addendum)
-   D/c symbicort 08/15/2014 - rx singulair 08/15/2014  - allergy profile 09/04/14  IgE 16, neg RAST, neg Eos - 09/04/2014 p extensive coaching HFA effectiveness =    75% >> rechallenged  with symbicort 80 2bid 09/04/14 >  Try off 09/18/14 just use singulair  Since not really clear how much is asthma rec trial again off the symbicort and max rx for gerd and complete the w/u with sinus CT next   I had an extended discussion with the patient reviewing all relevant studies completed to date and  lasting 15 to 20 minutes of a 25 minute visit    Each maintenance medication was reviewed in detail including most importantly the difference between maintenance and prns and under what circumstances the prns are to be triggered using an action plan format that is not reflected in the computer generated alphabetically organized AVS.    Please see instructions for details which were reviewed in writing and the patient given a copy highlighting the part that I personally wrote and discussed at today's ov.

## 2014-09-24 ENCOUNTER — Inpatient Hospital Stay: Admission: RE | Admit: 2014-09-24 | Payer: BLUE CROSS/BLUE SHIELD | Source: Ambulatory Visit

## 2014-10-13 ENCOUNTER — Telehealth: Payer: Self-pay | Admitting: Internal Medicine

## 2014-10-13 ENCOUNTER — Ambulatory Visit (INDEPENDENT_AMBULATORY_CARE_PROVIDER_SITE_OTHER)
Admission: RE | Admit: 2014-10-13 | Discharge: 2014-10-13 | Disposition: A | Discharge status: Home / Self Care | Payer: BLUE CROSS/BLUE SHIELD | Source: Ambulatory Visit | Attending: Internal Medicine | Admitting: Internal Medicine

## 2014-10-13 DIAGNOSIS — R05 Cough: Secondary | ICD-10-CM | POA: Diagnosis not present

## 2014-10-13 DIAGNOSIS — R058 Other specified cough: Secondary | ICD-10-CM

## 2014-10-13 NOTE — Progress Notes (Signed)
Quick Note:  LMTCB ______ 

## 2014-10-13 NOTE — Telephone Encounter (Signed)
Result Notes     Notes Recorded by Christen Butter, CMA on 10/13/2014 at 12:10 PM LMTCB ------  Notes Recorded by Nyoka Cowden, MD on 10/13/2014 at 11:01 AM Call patient : Study is unremarkable, no change in recs -- be sure has f/u ov if still having issues   lmtcb x1 for pt.

## 2014-10-14 NOTE — Telephone Encounter (Signed)
[phone] pt calling back

## 2014-10-14 NOTE — Telephone Encounter (Signed)
LMTCB x2  

## 2014-10-14 NOTE — Telephone Encounter (Signed)
LMTCB x1 for pt.  

## 2014-10-15 NOTE — Progress Notes (Signed)
Quick Note:  Spoke with pt and notified of results per Dr. Wert. Pt verbalized understanding and denied any questions.  ______ 

## 2014-10-15 NOTE — Telephone Encounter (Signed)
lmomtcb for pt 

## 2014-10-16 NOTE — Telephone Encounter (Signed)
Notes Recorded by Christen Butter, CMA on 10/15/2014 at 4:02 PM Spoke with pt and notified of results per Dr. Sherene Sires. Pt verbalized understanding and denied any questions.  Pt has already had results/recs discussed with him.  Nothing further needed at this time.

## 2015-12-22 ENCOUNTER — Encounter: Payer: Self-pay | Admitting: Family Medicine

## 2015-12-22 ENCOUNTER — Ambulatory Visit (INDEPENDENT_AMBULATORY_CARE_PROVIDER_SITE_OTHER): Payer: BLUE CROSS/BLUE SHIELD | Admitting: Family Medicine

## 2015-12-22 VITALS — BP 122/72 | HR 98 | Temp 98.8°F | Resp 17 | Ht >= 80 in | Wt 355.0 lb

## 2015-12-22 DIAGNOSIS — R1033 Periumbilical pain: Secondary | ICD-10-CM | POA: Diagnosis not present

## 2015-12-22 LAB — BASIC METABOLIC PANEL
BUN: 16 mg/dL (ref 7–25)
CO2: 21 mmol/L (ref 20–31)
Calcium: 9.3 mg/dL (ref 8.6–10.3)
Chloride: 108 mmol/L (ref 98–110)
Creat: 1.37 mg/dL — ABNORMAL HIGH (ref 0.60–1.35)
Glucose, Bld: 120 mg/dL — ABNORMAL HIGH (ref 65–99)
Potassium: 4.5 mmol/L (ref 3.5–5.3)
Sodium: 138 mmol/L (ref 135–146)

## 2015-12-22 NOTE — Progress Notes (Signed)
  Chief Complaint  Patient presents with  . Abdominal Pain    HPI  Pt reports that a few months history of abdominal pain with a bulge above his umbilicus No history of hernia as a child.  Reports that he has been able to feel a bulge when he eats and after a bowel movement.  He reports that he noticed the bulge sensation after he did a weight loss program and changed his diet radically to vegetables only.   Past Medical History:  Diagnosis Date  . Asthma   . Chronic headaches   . Chronic rhinitis 06/30/2011  . Depression   . GERD (gastroesophageal reflux disease) 06/30/2011  . OSA (obstructive sleep apnea) 06/30/2011   PSG 08/05/11>>AHI 32.8, SpO2 82%. CPAP 9 cm H2O>> 9 (mostly central hypopneas), +R, +S     No current outpatient prescriptions on file.   No current facility-administered medications for this visit.     Allergies: No Known Allergies  Past Surgical History:  Procedure Laterality Date  . ACHILLES TENDON REPAIR     right  . APPENDECTOMY      Social History   Social History  . Marital status: Divorced    Spouse name: N/A  . Number of children: N/A  . Years of education: N/A   Occupational History  . customer service supervisor    Social History Main Topics  . Smoking status: Never Smoker  . Smokeless tobacco: Never Used     Comment: exposed to second hand smoke  . Alcohol use No  . Drug use: No  . Sexual activity: Yes   Other Topics Concern  . None   Social History Narrative  . None    ROS  Objective: Vitals:   12/22/15 0857  BP: 122/72  Pulse: 98  Resp: 17  Temp: 98.8 F (37.1 C)  TempSrc: Oral  SpO2: 97%  Weight: (!) 355 lb (161 kg)  Height: 6' 9.5" (2.07 m)   Body mass index is 37.58 kg/m.  Physical Exam  Constitutional: He appears well-developed and well-nourished.  HENT:  Head: Normocephalic and atraumatic.  Eyes: Conjunctivae and EOM are normal.  Cardiovascular: Normal rate, regular rhythm and normal heart sounds.     No murmur heard. Pulmonary/Chest: Effort normal and breath sounds normal. No respiratory distress.  Abdominal: Soft. Bowel sounds are normal. He exhibits no distension and no mass. There is tenderness in the periumbilical area. There is no rebound and no guarding. No hernia.  Difficult to detect hernia due to abdominal obesity  Skin: Skin is warm. No erythema.    Assessment and Plan Windy FastRonald was seen today for abdominal pain.  Diagnoses and all orders for this visit:  Periumbilical abdominal pain-  Advised pt to follow up with CT abd pelvis Discussed that he should get a pelvic exam to check for inguinal hernias (declined today) He should also repeat a creatinine since the last creatinine was 1.6 in 2015 and he will need contrast to do the CT Reviewed s/sx of hernia and red flag symptoms -     CT Abdomen Pelvis W Contrast; Future -     Basic metabolic panel     Tiny Chaudhary A Creta LevinStallings

## 2015-12-22 NOTE — Patient Instructions (Addendum)
IF you received an x-ray today, you will receive an invoice from [facility]. Please contact [facility] at [phone] with questions or concerns regarding your invoice.   IF you received labwork today, you will receive an invoice from [employer]. Please contact [facility] at [phone] with questions or concerns regarding your invoice.   Our billing staff will not be able to assist you with questions regarding bills from these companies.  You will be contacted with the lab results as soon as they are available. The fastest way to get your results is to activate your My Chart account. Instructions are located on the last page of this paperwork. If you have not heard from [place] regarding the results in 2 weeks, please contact this office.     Ventral Hernia A ventral hernia (also called an incisional hernia) is a hernia that occurs at the site of a previous surgical cut (incision) in the abdomen. The abdominal wall spans from your lower chest down to your pelvis. If the abdominal wall is weakened from a surgical incision, a hernia can occur. A hernia is a bulge of bowel or muscle tissue pushing out on the weakened part of the abdominal wall. Ventral hernias can get bigger from straining or lifting. Obese and older people are at higher risk for a ventral hernia. People who develop infections after surgery or require repeat incisions at the same site on the abdomen are also at increased risk. CAUSES  A ventral hernia occurs because of weakness in the abdominal wall at an incision site.  SYMPTOMS  Common symptoms include:  A visible bulge or lump on the abdominal wall.  Pain or tenderness around the lump.  Increased discomfort if you cough or make a sudden movement. If the hernia has blocked part of the intestine, a serious complication can occur (incarcerated or strangulated hernia). This can become a problem that requires emergency surgery  because the blood flow to the blocked intestine may be cut off. Symptoms may include:  Feeling sick to your stomach (nauseous).  Throwing up (vomiting).  Stomach swelling (distention) or bloating.  Fever.  Rapid heartbeat. DIAGNOSIS  Your health care provider will take a medical history and perform a physical exam. Various tests may be ordered, such as:  Blood tests.  Urine tests.  Ultrasonography.  X-rays.  Computed tomography (CT). TREATMENT  Watchful waiting may be all that is needed for a smaller hernia that does not cause symptoms. Your health care provider may recommend the use of a supportive belt (truss) that helps to keep the abdominal wall intact. For larger hernias or those that cause pain, surgery to repair the hernia is usually recommended. If a hernia becomes strangulated, emergency surgery needs to be done right away. HOME CARE INSTRUCTIONS  Avoid putting pressure or strain on the abdominal area.  Avoid heavy lifting.  Use good body positioning for physical tasks. Ask your health care provider about proper body positioning.  Use a supportive belt as directed by your health care provider.  Maintain a healthy weight.  Eat foods that are high in fiber, such as whole grains, fruits, and vegetables. Fiber helps prevent difficult bowel movements (constipation).  Drink enough fluids to keep your urine clear or pale yellow.  Follow up with your health care provider as directed. SEEK MEDICAL CARE IF:   Your hernia seems to be getting larger or more painful. SEEK IMMEDIATE MEDICAL CARE IF:   You have abdominal pain that is  sudden and sharp.  Your pain becomes severe.  You have repeated vomiting.  You are sweating a lot.  You notice a rapid heartbeat.  You develop a fever. MAKE SURE YOU:   Understand these instructions.  Will watch your condition.  Will get help right away if you are not doing well or get worse.   This information is not intended  to replace advice given to you by your health care provider. Make sure you discuss any questions you have with your health care provider.   Document Released: 02/08/2012 Document Revised: 03/14/2014 Document Reviewed: 02/08/2012 Elsevier Interactive Patient Education [employer]2016 Elsevier Inc.

## 2015-12-30 ENCOUNTER — Ambulatory Visit
Admission: RE | Admit: 2015-12-30 | Discharge: 2015-12-30 | Disposition: A | Discharge status: Home / Self Care | Payer: BLUE CROSS/BLUE SHIELD | Source: Ambulatory Visit | Attending: Family Medicine | Admitting: Family Medicine

## 2015-12-30 DIAGNOSIS — R1033 Periumbilical pain: Secondary | ICD-10-CM

## 2015-12-30 DIAGNOSIS — K439 Ventral hernia without obstruction or gangrene: Secondary | ICD-10-CM | POA: Diagnosis not present

## 2015-12-30 DIAGNOSIS — K429 Umbilical hernia without obstruction or gangrene: Secondary | ICD-10-CM | POA: Diagnosis not present

## 2015-12-30 MED ORDER — IOPAMIDOL (ISOVUE-300) INJECTION 61%
125.0000 mL | Freq: Once | INTRAVENOUS | Status: AC | PRN
  Administered 2015-12-30: 125 mL via INTRAVENOUS

## 2016-01-01 ENCOUNTER — Encounter: Payer: Self-pay | Admitting: Family Medicine

## 2016-01-02 ENCOUNTER — Ambulatory Visit (INDEPENDENT_AMBULATORY_CARE_PROVIDER_SITE_OTHER): Payer: BLUE CROSS/BLUE SHIELD | Admitting: Family Medicine

## 2016-01-02 ENCOUNTER — Encounter: Payer: Self-pay | Admitting: Family Medicine

## 2016-01-02 VITALS — BP 128/76 | HR 72 | Temp 98.2°F | Resp 18 | Ht >= 80 in | Wt 360.0 lb

## 2016-01-02 DIAGNOSIS — Z23 Encounter for immunization: Secondary | ICD-10-CM | POA: Diagnosis not present

## 2016-01-02 DIAGNOSIS — F331 Major depressive disorder, recurrent, moderate: Secondary | ICD-10-CM

## 2016-01-02 MED ORDER — VENLAFAXINE HCL ER 75 MG PO CP24: 75.0000 mg | ORAL_CAPSULE | Freq: Every day | ORAL | 3 refills | Status: DC

## 2016-01-02 NOTE — Progress Notes (Signed)
  Chief Complaint  Patient presents with  . Depression    pt would like to try medication to help  . Immunizations    flu and t dap    HPI  He was on Effexor for 4 years then he stopped for 2 years He reports that he noticed that he is not sleeping well and eating well He feels amotivational  He was diagnosed at [age]   He reports that 4 years ago he was exercising and that helped his mood He is interested in getting back to exercise for weight loss. He is interested in knowing if     [facility] 01/02[facility]2016 12/22/2015  Decreased Interest 2 0  Down, Depressed, Hopeless 3 0  PHQ - 2 Score 5 0  Altered sleeping 3 -  Tired, decreased energy 3 -  Change in appetite 2 -  Feeling bad or failure about yourself  0 -  Trouble concentrating 3 -  Moving slowly or fidgety/restless 3 -  Suicidal thoughts 0 -  PHQ-9 Score 19 -    Past Medical History:  Diagnosis Date  . Asthma   . Chronic headaches   . Chronic rhinitis 06/30/2011  . Depression   . GERD (gastroesophageal reflux disease) 06/30/2011  . OSA (obstructive sleep apnea) 06/30/2011   PSG 08/05/11>>AHI 32.8, SpO2 82%. CPAP 9 cm H2O>> 9 (mostly central hypopneas), +R, +S     No current outpatient prescriptions on file.   No current facility-administered medications for this visit.     Allergies: No Known Allergies  Past Surgical History:  Procedure Laterality Date  . ACHILLES TENDON REPAIR     right  . APPENDECTOMY      Social History   Social History  . Marital status: Divorced    Spouse name: N/A  . Number of children: N/A  . Years of education: N/A   Occupational History  . customer service supervisor    Social History Main Topics  . Smoking status: Never Smoker  . Smokeless tobacco: Never Used     Comment: exposed to second hand smoke  . Alcohol use No  . Drug use: No  . Sexual activity: Yes   Other Topics Concern  . None   Social History Narrative  . None     ROS  Objective: Vitals:   01/02/16 1218  BP: 128/76  Pulse: 72  Resp: 18  Temp: 98.2 F (36.8 C)  TempSrc: Oral  SpO2: 97%  Weight: (!) 360 lb (163.3 kg)  Height: 6' 9.5" (2.07 m)    Physical Exam  Assessment and Plan Windy FastRonald was seen today for depression and immunizations.  Diagnoses and all orders for this visit:  Need for Tdap vaccination -     Tdap vaccine greater than or equal to 7yo IM  Flu vaccine need -     Flu Vaccine QUAD 36+ mos IM     Shaleen Talamantez A Naveed Humphres

## 2016-01-02 NOTE — Progress Notes (Signed)
ERROR

## 2016-01-02 NOTE — Patient Instructions (Signed)
   IF you received an x-ray today, you will receive an invoice from [facility]. Please contact [facility] at [phone] with questions or concerns regarding your invoice.   IF you received labwork today, you will receive an invoice from [facility]. Please contact [facility] at [phone] with questions or concerns regarding your invoice.   Our billing staff will not be able to assist you with questions regarding bills from these companies.  You will be contacted with the lab results as soon as they are available. The fastest way to get your results is to activate your My Chart account. Instructions are located on the last page of this paperwork. If you have not heard from us regarding the results in 2 weeks, please contact this office.    Influenza (Flu) Vaccine (Inactivated or Recombinant):  1. Why get vaccinated? Influenza ("flu") is a contagious disease that spreads around the [place] every year, usually between October and May. Flu is caused by influenza viruses, and is spread mainly by coughing, sneezing, and close contact. Anyone can get flu. Flu strikes suddenly and can last several days. Symptoms vary by age, but can include:  fever/chills  sore throat  muscle aches  fatigue  cough  headache  runny or stuffy nose Flu can also lead to pneumonia and blood infections, and cause diarrhea and seizures in children. If you have a medical condition, such as heart or lung disease, flu can make it worse. Flu is more dangerous for some people. Infants and young children, people 65 years of age and older, pregnant women, and people with certain health conditions or a weakened immune system are at greatest risk. Each year thousands of people in the [place] die from flu, and many more are hospitalized. Flu vaccine can:  keep you from getting flu,  make flu less severe if you do get it, and  keep you from spreading flu to  your family and other people. 2. Inactivated and recombinant flu vaccines A dose of flu vaccine is recommended every flu season. Children 6 months through 8 years of age may need two doses during the same flu season. Everyone else needs only one dose each flu season. Some inactivated flu vaccines contain a very small amount of a mercury-based preservative called thimerosal. Studies have not shown thimerosal in vaccines to be harmful, but flu vaccines that do not contain thimerosal are available. There is no live flu virus in flu shots. They cannot cause the flu. There are many flu viruses, and they are always changing. Each year a new flu vaccine is made to protect against three or four viruses that are likely to cause disease in the upcoming flu season. But even when the vaccine doesn't exactly match these viruses, it may still provide some protection. Flu vaccine cannot prevent:  flu that is caused by a virus not covered by the vaccine, or  illnesses that look like flu but are not. It takes about 2 weeks for protection to develop after vaccination, and protection lasts through the flu season. 3. Some people should not get this vaccine Tell the person who is giving you the vaccine:  If you have any severe, life-threatening allergies. If you ever had a life-threatening allergic reaction after a dose of flu vaccine, or have a severe allergy to any part of this vaccine, you may be advised not to get vaccinated. Most, but not all, types of flu vaccine contain a small amount of egg protein.    If you ever had Guillain-Barre Syndrome (also called GBS). Some people with a history of GBS should not get this vaccine. This should be discussed with your doctor.  If you are not feeling well. It is usually okay to get flu vaccine when you have a mild illness, but you might be asked to come back when you feel better. 4. Risks of a vaccine reaction With any medicine, including vaccines, there is a chance of  reactions. These are usually mild and go away on their own, but serious reactions are also possible. Most people who get a flu shot do not have any problems with it. Minor problems following a flu shot include:  soreness, redness, or swelling where the shot was given  hoarseness  sore, red or itchy eyes  cough  fever  aches  headache  itching  fatigue If these problems occur, they usually begin soon after the shot and last 1 or 2 days. More serious problems following a flu shot can include the following:  There may be a small increased risk of Guillain-Barre Syndrome (GBS) after inactivated flu vaccine. This risk has been estimated at 1 or 2 additional cases per million people vaccinated. This is much lower than the risk of severe complications from flu, which can be prevented by flu vaccine.  Young children who get the flu shot along with pneumococcal vaccine (PCV13) and/or DTaP vaccine at the same time might be slightly more likely to have a seizure caused by fever. Ask your doctor for more information. Tell your doctor if a child who is getting flu vaccine has ever had a seizure. Problems that could happen after any injected vaccine:  People sometimes faint after a medical procedure, including vaccination. Sitting or lying down for about 15 minutes can help prevent fainting, and injuries caused by a fall. Tell your doctor if you feel dizzy, or have vision changes or ringing in the ears.  Some people get severe pain in the shoulder and have difficulty moving the arm where a shot was given. This happens very rarely.  Any medication can cause a severe allergic reaction. Such reactions from a vaccine are very rare, estimated at about 1 in a million doses, and would happen within a few minutes to a few hours after the vaccination. As with any medicine, there is a very remote chance of a vaccine causing a serious injury or death. The safety of vaccines is always being monitored. For  more information, visit: www.cdc.gov/vaccinesafety/ 5. What if there is a serious reaction? What should I look for?  Look for anything that concerns you, such as signs of a severe allergic reaction, very high fever, or unusual behavior. Signs of a severe allergic reaction can include hives, swelling of the face and throat, difficulty breathing, a fast heartbeat, dizziness, and weakness. These would start a few minutes to a few hours after the vaccination. What should I do?  If you think it is a severe allergic reaction or other emergency that can't wait, call 9-1-1 and get the person to the nearest hospital. Otherwise, call your doctor.  Reactions should be reported to the Vaccine Adverse Event Reporting System (VAERS). Your doctor should file this report, or you can do it yourself through the VAERS web site at www.vaers.hhs.gov, or by calling 1-800-822-7967. VAERS does not give medical advice. 6. The National Vaccine Injury Compensation Program The National Vaccine Injury Compensation Program (VICP) is a federal program that was created to compensate people who may have been   injured by certain vaccines. Persons who believe they may have been injured by a vaccine can learn about the program and about filing a claim by calling 1-800-338-2382 or visiting the VICP website at www.hrsa.gov/vaccinecompensation. There is a time limit to file a claim for compensation. 7. How can I learn more?  Ask your healthcare provider. He or she can give you the vaccine package insert or suggest other sources of information.  Call your local or state health department.  Contact the Centers for Disease Control and Prevention (CDC):  Call 1-800-232-4636 (1-800-CDC-INFO) or  Visit CDC's website at www.cdc.gov/flu Vaccine Information Statement Inactivated Influenza Vaccine (10/11/2013)   This information is not intended to replace advice given to you by your health care provider. Make sure you discuss any  questions you have with your health care provider.   Document Released: 12/16/2005 Document Revised: 03/14/2014 Document Reviewed: 10/14/2013 Elsevier Interactive Patient Education 2016 Elsevier Inc.  

## 2016-01-04 NOTE — Telephone Encounter (Signed)
Pt came into be seen 10/28

## 2016-01-06 ENCOUNTER — Ambulatory Visit: Payer: BLUE CROSS/BLUE SHIELD | Admitting: Family Medicine

## 2016-01-26 DIAGNOSIS — R635 Abnormal weight gain: Secondary | ICD-10-CM | POA: Diagnosis not present

## 2016-01-26 DIAGNOSIS — E291 Testicular hypofunction: Secondary | ICD-10-CM | POA: Diagnosis not present

## 2016-02-08 ENCOUNTER — Encounter: Payer: Self-pay | Admitting: Family Medicine

## 2016-02-10 DIAGNOSIS — E559 Vitamin D deficiency, unspecified: Secondary | ICD-10-CM | POA: Diagnosis not present

## 2016-02-10 DIAGNOSIS — E782 Mixed hyperlipidemia: Secondary | ICD-10-CM | POA: Diagnosis not present

## 2016-02-10 DIAGNOSIS — E6609 Other obesity due to excess calories: Secondary | ICD-10-CM | POA: Diagnosis not present

## 2016-02-10 DIAGNOSIS — E538 Deficiency of other specified B group vitamins: Secondary | ICD-10-CM | POA: Diagnosis not present

## 2016-02-10 DIAGNOSIS — R7303 Prediabetes: Secondary | ICD-10-CM | POA: Diagnosis not present

## 2016-02-10 DIAGNOSIS — E291 Testicular hypofunction: Secondary | ICD-10-CM | POA: Diagnosis not present

## 2016-02-22 ENCOUNTER — Ambulatory Visit (INDEPENDENT_AMBULATORY_CARE_PROVIDER_SITE_OTHER): Payer: BLUE CROSS/BLUE SHIELD | Admitting: Family Medicine

## 2016-02-22 VITALS — BP 118/76 | HR 80 | Temp 98.2°F | Resp 18 | Ht >= 80 in | Wt 364.0 lb

## 2016-02-22 DIAGNOSIS — E6609 Other obesity due to excess calories: Secondary | ICD-10-CM | POA: Diagnosis not present

## 2016-02-22 DIAGNOSIS — Z6838 Body mass index (BMI) 38.0-38.9, adult: Secondary | ICD-10-CM

## 2016-02-22 DIAGNOSIS — F331 Major depressive disorder, recurrent, moderate: Secondary | ICD-10-CM

## 2016-02-22 NOTE — Progress Notes (Signed)
Chief Complaint  Patient presents with  . Follow-up    FMLA/medication    HPI  Depression Pt here for FMLA forms for work He needs forms completed for his depression He states that he has noted that the effexor is helping his mood and his only side effect is electric shocks.   Obesity- he is a part of a weight loss program He states that he was told by nutritionist to try to stick to a 1700 calories.  He was advised to exercise 3 times a week.  He reports that his cholesterol was high.  He was advised to take multivitamins.  He was told that his testosterone is low on their blood test. Wt Readings from Last 3 Encounters:  02/22/16 (!) 364 lb (165.1 kg)  01/02/16 (!) 360 lb (163.3 kg)  12/22/15 (!) 355 lb (161 kg)   Body mass index is 38.53 kg/m.  Past Medical History:  Diagnosis Date  . Asthma   . Chronic headaches   . Chronic rhinitis 06/30/2011  . Depression   . GERD (gastroesophageal reflux disease) 06/30/2011  . OSA (obstructive sleep apnea) 06/30/2011   PSG 08/05/11>>AHI 32.8, SpO2 82%. CPAP 9 cm H2O>> 9 (mostly central hypopneas), +R, +S     Current Outpatient Prescriptions  Medication Sig Dispense Refill  . venlafaxine XR (EFFEXOR XR) 75 MG 24 hr capsule Take 1 capsule (75 mg total) by mouth daily with breakfast. 30 capsule 3   No current facility-administered medications for this visit.     Allergies: No Known Allergies  Past Surgical History:  Procedure Laterality Date  . ACHILLES TENDON REPAIR     right  . APPENDECTOMY      Social History   Social History  . Marital status: Divorced    Spouse name: N/A  . Number of children: N/A  . Years of education: N/A   Occupational History  . customer service supervisor    Social History Main Topics  . Smoking status: Never Smoker  . Smokeless tobacco: Never Used     Comment: exposed to second hand smoke  . Alcohol use No  . Drug use: No  . Sexual activity: Yes   Other Topics Concern  . None    Social History Narrative  . None    Review of Systems  Constitutional: Negative for chills, fever and weight loss.  Respiratory: Negative for cough, shortness of breath and wheezing.   Cardiovascular: Negative for chest pain, palpitations and orthopnea.  Gastrointestinal: Negative for abdominal pain, nausea and vomiting.  Skin: Negative for itching and rash.  Neurological: Negative for dizziness, tingling and headaches.    Objective: Vitals:   02/22/16 0934  BP: 118/76  Pulse: 80  Resp: 18  Temp: 98.2 F (36.8 C)  TempSrc: Oral  SpO2: 96%  Weight: (!) 364 lb (165.1 kg)  Height: 6' 9.5" (2.07 m)    Physical Exam  Constitutional: He is oriented to person, place, and time. He appears well-developed and well-nourished.  HENT:  Head: Normocephalic and atraumatic.  Eyes: Conjunctivae and EOM are normal.  Cardiovascular: Normal rate, regular rhythm and normal heart sounds.   Pulmonary/Chest: Effort normal and breath sounds normal. No respiratory distress. He has no wheezes.  Neurological: He is alert and oriented to person, place, and time.    Assessment and Plan Windy FastRonald was seen today for follow-up.  Diagnoses and all orders for this visit:  Moderate episode of recurrent major depressive disorder (HCC)- stable on current meds Completed FMLA form  so pt can take time for office visits  Class 2 obesity due to excess calories without serious comorbidity with body mass index (BMI) of 38.0 to 38.9 in adult- pt has a good plan including nutrition and exercise Will follow closely Advised repeat labs in 3 months since he just had labs at the nutritionist    Birttany Dechellis A Creta LevinStallings

## 2016-02-22 NOTE — Patient Instructions (Addendum)
     IF you received an x-ray today, you will receive an invoice from [facility]. Please contact [facility] at [phone] with questions or concerns regarding your invoice.   IF you received labwork today, you will receive an invoice from [facility]. Please contact [facility] at [phone] with questions or concerns regarding your invoice.   Our billing staff will not be able to assist you with questions regarding bills from these companies.  You will be contacted with the lab results as soon as they are available. The fastest way to get your results is to activate your My Chart account. Instructions are located on the last page of this paperwork. If you have not heard from us regarding the results in 2 weeks, please contact this office.      Food Choices for Gastroesophageal Reflux Disease, Adult When you have gastroesophageal reflux disease (GERD), the foods you eat and your eating habits are very important. Choosing the right foods can help ease your discomfort. What guidelines do I need to follow?  Choose fruits, vegetables, whole grains, and low-fat dairy products.  Choose low-fat meat, fish, and poultry.  Limit fats such as oils, salad dressings, butter, nuts, and avocado.  Keep a food diary. This helps you identify foods that cause symptoms.  Avoid foods that cause symptoms. These may be different for everyone.  Eat small meals often instead of 3 large meals a day.  Eat your meals slowly, in a place where you are relaxed.  Limit fried foods.  Cook foods using methods other than frying.  Avoid drinking alcohol.  Avoid drinking large amounts of liquids with your meals.  Avoid bending over or lying down until 2-3 hours after eating. What foods are not recommended? These are some foods and drinks that may make your symptoms worse: Vegetables Tomatoes. Tomato juice. Tomato and spaghetti sauce. Chili peppers. Onion and garlic.  Horseradish. Fruits Oranges, grapefruit, and lemon (fruit and juice). Meats High-fat meats, fish, and poultry. This includes hot dogs, ribs, ham, sausage, salami, and bacon. Dairy Whole milk and chocolate milk. Sour cream. Cream. Butter. Ice cream. Cream cheese. Drinks Coffee and tea. Bubbly (carbonated) drinks or energy drinks. Condiments Hot sauce. Barbecue sauce. Sweets/Desserts Chocolate and cocoa. Donuts. Peppermint and spearmint. Fats and Oils High-fat foods. This includes French fries and potato chips. Other Vinegar. Strong spices. This includes black pepper, white pepper, red pepper, cayenne, curry powder, cloves, ginger, and chili powder. The items listed above may not be a complete list of foods and drinks to avoid. Contact your dietitian for more information. This information is not intended to replace advice given to you by your health care provider. Make sure you discuss any questions you have with your health care provider. Document Released: 08/23/2011 Document Revised: 07/30/2015 Document Reviewed: 12/26/2012 Elsevier Interactive Patient Education  2017 Elsevier Inc.  

## 2016-02-23 DIAGNOSIS — E6609 Other obesity due to excess calories: Secondary | ICD-10-CM | POA: Diagnosis not present

## 2016-02-23 DIAGNOSIS — R7303 Prediabetes: Secondary | ICD-10-CM | POA: Diagnosis not present

## 2016-02-23 DIAGNOSIS — E782 Mixed hyperlipidemia: Secondary | ICD-10-CM | POA: Diagnosis not present

## 2016-02-23 DIAGNOSIS — E538 Deficiency of other specified B group vitamins: Secondary | ICD-10-CM | POA: Diagnosis not present

## 2016-03-03 ENCOUNTER — Encounter: Payer: Self-pay | Admitting: Family Medicine

## 2016-03-11 ENCOUNTER — Telehealth: Payer: Self-pay

## 2016-03-11 NOTE — Telephone Encounter (Signed)
Paperwork updated, scanned and faxed to [employer] on 03/11/16

## 2016-03-11 NOTE — Telephone Encounter (Signed)
[employer] needs patients disability forms updated on his intermittent leave and also an estimate on frequency of this leave. I have highlighted the area they need updated. I will place the forms in Dr Creta LevinStallings box on 03/11/16 if you could please return them to the FMLA/Disability box at the [employer] checkout desk within 5-7 business days.  Thank you!

## 2016-05-23 ENCOUNTER — Other Ambulatory Visit: Payer: Self-pay | Admitting: Family Medicine

## 2016-07-29 ENCOUNTER — Other Ambulatory Visit: Payer: Self-pay | Admitting: Physician Assistant

## 2016-07-30 ENCOUNTER — Encounter: Payer: Self-pay | Admitting: Family Medicine

## 2016-07-30 ENCOUNTER — Ambulatory Visit (INDEPENDENT_AMBULATORY_CARE_PROVIDER_SITE_OTHER): Payer: BLUE CROSS/BLUE SHIELD | Admitting: Family Medicine

## 2016-07-30 VITALS — BP 132/92 | HR 69 | Temp 98.6°F | Resp 16 | Ht >= 80 in | Wt 346.4 lb

## 2016-07-30 DIAGNOSIS — Z0000 Encounter for general adult medical examination without abnormal findings: Secondary | ICD-10-CM

## 2016-07-30 DIAGNOSIS — R7303 Prediabetes: Secondary | ICD-10-CM

## 2016-07-30 DIAGNOSIS — Z125 Encounter for screening for malignant neoplasm of prostate: Secondary | ICD-10-CM | POA: Diagnosis not present

## 2016-07-30 DIAGNOSIS — R7989 Other specified abnormal findings of blood chemistry: Secondary | ICD-10-CM

## 2016-07-30 DIAGNOSIS — E559 Vitamin D deficiency, unspecified: Secondary | ICD-10-CM | POA: Diagnosis not present

## 2016-07-30 NOTE — Progress Notes (Signed)
Chief Complaint  Patient presents with  . Annual Exam    Subjective:  Darryl Morton is a 46 y.o. male here for a health maintenance visit.  Patient is established pt  He reports that he had high sugars of 226 on his biometric screening test that he thought was due to his stimulant free supplements for weight loss and to boost his testosterone.  He would like to get checked for diabetes  A1C 5.9% 01/27/2016  LDL 175 hdl 38 TC 246 PSA 0.5 Vitamin D 5.6   His supplement has Ashwagandha extract, saw alpmetto, cayenne pepper Yohimbine, black pepper    saw palmetto, stinging nettle, wild yam root  Patient Active Problem List   Diagnosis Date Noted  . Obesity (BMI 30-39.9) 09/07/2014  . Upper airway cough syndrome vs cough variant asthma 06/30/2011  . Chronic rhinitis 06/30/2011  . Asthma 06/30/2011  . GERD (gastroesophageal reflux disease) 06/30/2011  . OSA (obstructive sleep apnea) 06/30/2011    Past Medical History:  Diagnosis Date  . Asthma   . Chronic headaches   . Chronic rhinitis 06/30/2011  . Depression   . GERD (gastroesophageal reflux disease) 06/30/2011  . OSA (obstructive sleep apnea) 06/30/2011   PSG 08/05/11>>AHI 32.8, SpO2 82%. CPAP 9 cm H2O>> 9 (mostly central hypopneas), +R, +S     Past Surgical History:  Procedure Laterality Date  . ACHILLES TENDON REPAIR     right  . APPENDECTOMY       Outpatient Medications Prior to Visit  Medication Sig Dispense Refill  . venlafaxine XR (EFFEXOR-XR) 75 MG 24 hr capsule TAKE ONE CAPSULE BY MOUTH EVERY MORNING WITH BREAKFAST 60 capsule 0   No facility-administered medications prior to visit.     No Known Allergies   Family History  Problem Relation Age of Onset  . Asthma Unknown        father side  . Allergies Unknown        father side     Health Habits: Dental Exam: up to date Eye Exam: up to date Exercise: 5-7 times/week on average Current exercise activities: walking/running Diet: balanced  high protein diet  Social History   Social History  . Marital status: Divorced    Spouse name: N/A  . Number of children: N/A  . Years of education: N/A   Occupational History  . customer service supervisor    Social History Main Topics  . Smoking status: Never Smoker  . Smokeless tobacco: Never Used     Comment: exposed to second hand smoke  . Alcohol use No  . Drug use: No  . Sexual activity: Yes   Other Topics Concern  . Not on file   Social History Narrative  . No narrative on file   History  Alcohol Use No   History  Smoking Status  . Never Smoker  Smokeless Tobacco  . Never Used    Comment: exposed to second hand smoke   History  Drug Use No    Health Maintenance: See under health Maintenance activity for review of completion dates as well. Immunization History  Administered Date(s) Administered  . Influenza Split 12/06/2010  . Influenza,inj,Quad PF,36+ Mos 01/02/2016  . Tdap 01/02/2016      Depression Screen-PHQ2/9 Depression screen [facility] 2/9 07/30/2016 01/02/2016 12/22/2015  Decreased Interest 0 2 0  Down, Depressed, Hopeless 0 3 0  PHQ - 2 Score 0 5 0  Altered sleeping - 3 -  Tired, decreased energy - 3 -  Change in  appetite - 2 -  Feeling bad or failure about yourself  - 0 -  Trouble concentrating - 3 -  Moving slowly or fidgety/restless - 3 -  Suicidal thoughts - 0 -  PHQ-9 Score - 19 -       Depression Severity and Treatment Recommendations:  0-4= None  5-9= Mild / Treatment: Support, educate to call if worse; return in one month  10-14= Moderate / Treatment: Support, watchful waiting; Antidepressant or Psycotherapy  15-19= Moderately severe / Treatment: Antidepressant OR Psychotherapy  >= 20 = Major depression, severe / Antidepressant AND Psychotherapy    Review of Systems   Review of Systems  Constitutional: Negative for chills, fever and weight loss.  Eyes: Negative for blurred vision and double vision.  Cardiovascular:  Negative for chest pain and palpitations.  Gastrointestinal: Negative for abdominal pain, nausea and vomiting.  Genitourinary: Negative for dysuria, frequency and urgency.  Skin: Negative for itching and rash.  Neurological: Negative for dizziness, tingling and headaches.  Psychiatric/Behavioral: Negative for depression. The patient is not nervous/anxious.     See HPI for ROS as well.    Objective:   Vitals:   07/30/16 1039 07/30/16 1248  BP: (!) 142/80 (!) 132/92  Pulse: 69   Resp: 16   Temp: 98.6 F (37 C)   TempSrc: Oral   SpO2: 96%   Weight: (!) 346 lb 6.4 oz (157.1 kg)   Height: 6' 9.5" (2.07 m)   Body mass index is 36.67 kg/m.  BP Readings from Last 3 Encounters:  07/30/16 (!) 132/92  02/22/16 118/76  01/02/16 128/76   Wt Readings from Last 3 Encounters:  07/30/16 (!) 346 lb 6.4 oz (157.1 kg)  02/22/16 (!) 364 lb (165.1 kg)  01/02/16 (!) 360 lb (163.3 kg)    Body mass index is 36.67 kg/m.  Physical Exam  Constitutional: He is oriented to person, place, and time. He appears well-developed and well-nourished.  HENT:  Head: Normocephalic and atraumatic.  Right Ear: External ear normal.  Left Ear: External ear normal.  Nose: Nose normal.  Mouth/Throat: Oropharynx is clear and moist.  Eyes: Conjunctivae and EOM are normal.  Neck: Normal range of motion. Neck supple. No thyromegaly present.  Cardiovascular: Normal rate, regular rhythm and normal heart sounds.   No murmur heard. Pulmonary/Chest: Effort normal and breath sounds normal. No respiratory distress. He has no wheezes.  Abdominal: Soft. Bowel sounds are normal. He exhibits no distension. There is no tenderness. There is no rebound and no guarding.  Musculoskeletal: Normal range of motion. He exhibits no edema.  Neurological: He is alert and oriented to person, place, and time.  Skin: Skin is warm. No rash noted. No erythema.  Psychiatric: He has a normal mood and affect. His behavior is normal.  Judgment and thought content normal.     Assessment/Plan:   Patient was seen for a health maintenance exam.  Counseled the patient on health maintenance issues. Reviewed her health mainteance schedule and ordered appropriate tests (see orders.) Counseled on regular exercise and weight management. Recommend regular eye exams and dental cleaning.   The following issues were addressed today for health maintenance:   Windy FastRonald was seen today for annual exam.  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    Body mass index is 36.67 kg/m.:  Discussed the patient's BMI with patient. The BMI body mass index is 36.67 kg/m.     No future appointments.  Patient Instructions       IF you received an x-ray today, you will receive an invoice from [facility]. Please contact [facility] at [phone] with questions or concerns regarding your invoice.   IF you received labwork today, you will receive an invoice from [facility]. Please contact [facility] at [phone] with questions or concerns regarding your invoice.   Our billing staff will not be able to assist you with questions regarding bills from these companies.  You will be contacted with the lab results as soon as they are available. The fastest way to get your results is to activate your My Chart account. Instructions are located on the last page of this paperwork. If  you have not heard from [place] regarding the results in 2 weeks, please contact this office.

## 2016-07-30 NOTE — Patient Instructions (Signed)
     IF you received an x-ray today, you will receive an invoice from [facility]. Please contact [facility] at [phone] with questions or concerns regarding your invoice.   IF you received labwork today, you will receive an invoice from [facility]. Please contact [facility] at [phone] with questions or concerns regarding your invoice.   Our billing staff will not be able to assist you with questions regarding bills from these companies.  You will be contacted with the lab results as soon as they are available. The fastest way to get your results is to activate your My Chart account. Instructions are located on the last page of this paperwork. If you have not heard from us regarding the results in 2 weeks, please contact this office.     

## 2016-07-31 LAB — LIPID PANEL
Chol/HDL Ratio: 6.2 ratio — ABNORMAL HIGH (ref 0.0–5.0)
Cholesterol, Total: 249 mg/dL — ABNORMAL HIGH (ref 100–199)
HDL: 40 mg/dL (ref >39)
LDL Calculated: 183 mg/dL — ABNORMAL HIGH (ref 0–99)
Triglycerides: 128 mg/dL (ref 0–149)
VLDL Cholesterol Cal: 26 mg/dL (ref 5–40)

## 2016-07-31 LAB — CBC
Hematocrit: 44.7 % (ref 37.5–51.0)
Hemoglobin: 15.5 g/dL (ref 13.0–17.7)
MCH: 31.3 pg (ref 26.6–33.0)
MCHC: 34.7 g/dL (ref 31.5–35.7)
MCV: 90 fL (ref 79–97)
Platelets: 237 10*3/uL (ref 150–379)
RBC: 4.95 x10E6/uL (ref 4.14–5.80)
RDW: 14 % (ref 12.3–15.4)
WBC: 3.7 10*3/uL (ref 3.4–10.8)

## 2016-07-31 LAB — HEMOGLOBIN A1C
Est. average glucose Bld gHb Est-mCnc: 123 mg/dL
Hgb A1c MFr Bld: 5.9 % — ABNORMAL HIGH (ref 4.8–5.6)

## 2016-07-31 LAB — PSA: Prostate Specific Ag, Serum: 0.4 ng/mL (ref 0.0–4.0)

## 2016-07-31 LAB — TSH: TSH: 1.03 u[IU]/mL (ref 0.450–4.500)

## 2016-08-03 ENCOUNTER — Other Ambulatory Visit: Payer: Self-pay | Admitting: Physician Assistant

## 2016-08-03 ENCOUNTER — Encounter: Payer: Self-pay | Admitting: Family Medicine

## 2016-08-05 MED ORDER — VENLAFAXINE HCL ER 75 MG PO CP24: 75.0000 mg | ORAL_CAPSULE | Freq: Every day | ORAL | 0 refills | Status: DC

## 2016-08-10 MED ORDER — PRAVASTATIN SODIUM 20 MG PO TABS: 20.0000 mg | ORAL_TABLET | Freq: Every day | ORAL | 3 refills | Status: DC

## 2016-11-18 ENCOUNTER — Other Ambulatory Visit: Payer: Self-pay | Admitting: Family Medicine

## 2016-11-26 ENCOUNTER — Ambulatory Visit (INDEPENDENT_AMBULATORY_CARE_PROVIDER_SITE_OTHER): Payer: BLUE CROSS/BLUE SHIELD | Admitting: Family Medicine

## 2016-11-26 ENCOUNTER — Encounter: Payer: Self-pay | Admitting: Family Medicine

## 2016-11-26 VITALS — BP 118/83 | HR 74 | Temp 98.8°F | Resp 16 | Ht >= 80 in | Wt 361.0 lb

## 2016-11-26 DIAGNOSIS — H02843 Edema of right eye, unspecified eyelid: Secondary | ICD-10-CM | POA: Diagnosis not present

## 2016-11-26 DIAGNOSIS — G44209 Tension-type headache, unspecified, not intractable: Secondary | ICD-10-CM | POA: Diagnosis not present

## 2016-11-26 DIAGNOSIS — Z23 Encounter for immunization: Secondary | ICD-10-CM | POA: Diagnosis not present

## 2016-11-26 DIAGNOSIS — J4531 Mild persistent asthma with (acute) exacerbation: Secondary | ICD-10-CM | POA: Diagnosis not present

## 2016-11-26 DIAGNOSIS — G4733 Obstructive sleep apnea (adult) (pediatric): Secondary | ICD-10-CM | POA: Diagnosis not present

## 2016-11-26 MED ORDER — TOBRAMYCIN-DEXAMETHASONE 0.3-0.1 % OP SUSP: 1.0000 [drp] | OPHTHALMIC | 1 refills | Status: DC

## 2016-11-26 MED ORDER — ALBUTEROL SULFATE HFA 108 (90 BASE) MCG/ACT IN AERS: 2.0000 | INHALATION_SPRAY | Freq: Four times a day (QID) | RESPIRATORY_TRACT | 2 refills | Status: DC | PRN

## 2016-11-26 NOTE — Patient Instructions (Addendum)
   IF you received an x-ray today, you will receive an invoice from [facility]. Please contact [facility] at [phone] with questions or concerns regarding your invoice.   IF you received labwork today, you will receive an invoice from [facility]. Please contact [facility] at [phone] with questions or concerns regarding your invoice.   Our billing staff will not be able to assist you with questions regarding bills from these companies.  You will be contacted with the lab results as soon as they are available. The fastest way to get your results is to activate your My Chart account. Instructions are located on the last page of this paperwork. If you have not heard from us regarding the results in 2 weeks, please contact this office.     Asthma Attack Prevention, Adult Although you may not be able to control the fact that you have asthma, you can take actions to prevent episodes of asthma (asthma attacks). These actions include:  Creating a written plan for managing and treating your asthma attacks (asthma action plan).  Monitoring your asthma.  Avoiding things that can irritate your airways or make your asthma symptoms worse (asthma triggers).  Taking your medicines as directed.  Acting quickly if you have signs or symptoms of an asthma attack.  What are some ways to prevent an asthma attack? Create a plan Work with your health care provider to create an asthma action plan. This plan should include:  A list of your asthma triggers and how to avoid them.  A list of symptoms that you experience during an asthma attack.  Information about when to take medicine and how much medicine to take.  Information to help you understand your peak flow measurements.  Contact information for your health care providers.  Daily actions that you can take to control asthma.  Monitor your asthma  To monitor your asthma:  Use your peak flow meter every morning and  every evening for 2-3 weeks. Record the results in a journal. A drop in your peak flow numbers on one or more days may mean that you are starting to have an asthma attack, even if you are not having symptoms.  When you have asthma symptoms, write them down in a journal.  Avoid asthma triggers  Work with your health care provider to find out what your asthma triggers are. This can be done by:  Being tested for allergies.  Keeping a journal that notes when asthma attacks occur and what may have contributed to them.  Asking your health care provider whether other medical conditions make your asthma worse.  Common asthma triggers include:  Dust.  Smoke. This includes campfire smoke and secondhand smoke from tobacco products.  Pet dander.  Trees, grasses or pollens.  Very cold, dry, or humid air.  Mold.  Foods that contain high amounts of sulfites.  Strong smells.  Engine exhaust and air pollution.  Aerosol sprays and fumes from household cleaners.  Household pests and their droppings, including dust mites and cockroaches.  Certain medicines, including NSAIDs.  Once you have determined your asthma triggers, take steps to avoid them. Depending on your triggers, you may be able to reduce the chance of an asthma attack by:  Keeping your home clean. Have someone dust and vacuum your home for you 1 or 2 times a week. If possible, have them use a high-efficiency particulate arrestance (HEPA) vacuum.  Washing your sheets weekly in hot water.  Using allergy-proof mattress covers and casings   on your bed.  Keeping pets out of your home.  Taking care of mold and water problems in your home.  Avoiding areas where people smoke.  Avoiding using strong perfumes or odor sprays.  Avoid spending a lot of time outdoors when pollen counts are high and on very windy days.  Talking with your health care provider before stopping or starting any new medicines.  Medicines Take  over-the-counter and prescription medicines only as told by your health care provider. Many asthma attacks can be prevented by carefully following your medicine schedule. Taking your medicines correctly is especially important when you cannot avoid certain asthma triggers. Even if you are doing well, do not stop taking your medicine and do not take less medicine. Act quickly If an asthma attack happens, acting quickly can decrease how severe it is and how long it lasts. Take these actions:  Pay attention to your symptoms. If you are coughing, wheezing, or having difficulty breathing, do not wait to see if your symptoms go away on their own. Follow your asthma action plan.  If you have followed your asthma action plan and your symptoms are not improving, call your health care provider or seek immediate medical care at the nearest hospital.  It is important to write down how often you need to use your fast-acting rescue inhaler. You can track how often you use an inhaler in your journal. If you are using your rescue inhaler more often, it may mean that your asthma is not under control. Adjusting your asthma treatment plan may help you to prevent future asthma attacks and help you to gain better control of your condition. How can I prevent an asthma attack when I exercise?  Exercise is a common asthma trigger. To prevent asthma attacks during exercise:  Follow advice from your health care provider about whether you should use your fast-acting inhaler before exercising. Many people with asthma experience exercise-induced bronchoconstriction (EIB). This condition often worsens during vigorous exercise in cold, humid, or dry environments. Usually, people with EIB can stay very active by using a fast-acting inhaler before exercising.  Avoid exercising outdoors in very cold or humid weather.  Avoid exercising outdoors when pollen counts are high.  Warm up and cool down when exercising.  Stop exercising  right away if asthma symptoms start.  Consider taking part in exercises that are less likely to cause asthma symptoms such as:  Indoor swimming.  Biking.  Walking.  Hiking.  Playing football.  This information is not intended to replace advice given to you by your health care provider. Make sure you discuss any questions you have with your health care provider. Document Released: 02/09/2009 Document Revised: 10/23/2015 Document Reviewed: 08/08/2015 Elsevier Interactive Patient Education  2017 Elsevier Inc.  

## 2016-11-26 NOTE — Progress Notes (Signed)
Chief Complaint  Patient presents with  . Eye Problem  . Shortness of Breath    cough,   . Headache    HPI   Upper lid eye swelling Pt reports that his right eye lid has been swollen and heavy. He also notes some itching fo the right eye and some drainage He thinks it is related to his allergies. - no blurry vision -  No double vision -  No pain with eye movement  Shortness of breath Pt reports that he has not been taking any asthma medication He went up a flight of stairs and started coughing and wheezing His coworker told him that he has similar symptoms to her and she has congestive heart failure and was taking water pills. He went to the [employer] store and got mhp xpel which is a combination of caffeine and herbals. He states that he took this several days He continues to feel a little short of breath with activities -  He denies swelling in his extremities -  He denies PND -   He does not see a relationship between the shortness and salty foods  OSA noncompliant and headache He reports that he has not use his cpap in a few years He needs a new mask He gets headaches daily and feels very tired and does not sleep well His headache seem to be like a squeezing of his scalp    Past Medical History:  Diagnosis Date  . Asthma   . Chronic headaches   . Chronic rhinitis 06/30/2011  . Depression   . GERD (gastroesophageal reflux disease) 06/30/2011  . OSA (obstructive sleep apnea) 06/30/2011   PSG 08/05/11>>AHI 32.8, SpO2 82%. CPAP 9 cm H2O>> 9 (mostly central hypopneas), +R, +S     Current Outpatient Prescriptions  Medication Sig Dispense Refill  . pravastatin (PRAVACHOL) 20 MG tablet Take 1 tablet (20 mg total) by mouth daily. 90 tablet 3  . venlafaxine XR (EFFEXOR-XR) 75 MG 24 hr capsule Take 1 capsule (75 mg total) by mouth daily with breakfast. No more refills without office visit 90 capsule 0  . albuterol (PROVENTIL HFA;VENTOLIN HFA) 108 (90 Base) MCG/ACT inhaler  Inhale 2 puffs into the lungs every 6 (six) hours as needed for wheezing or shortness of breath. 1 Inhaler 2  . tobramycin-dexamethasone (TOBRADEX) ophthalmic solution Place 1 drop into the right eye every 4 (four) hours while awake. 5 mL 1   No current facility-administered medications for this visit.     Allergies: No Known Allergies  Past Surgical History:  Procedure Laterality Date  . ACHILLES TENDON REPAIR     right  . APPENDECTOMY      Social History   Social History  . Marital status: Divorced    Spouse name: N/A  . Number of children: N/A  . Years of education: N/A   Occupational History  . customer service supervisor    Social History Main Topics  . Smoking status: Never Smoker  . Smokeless tobacco: Never Used     Comment: exposed to second hand smoke  . Alcohol use No  . Drug use: No  . Sexual activity: Yes   Other Topics Concern  . None   Social History Narrative  . None    Review of Systems  Constitutional: Negative for chills, fever and weight loss.  Gastrointestinal: Negative for abdominal pain, nausea and vomiting.  Genitourinary: Negative for dysuria, frequency and urgency.  Neurological: Negative for sensory change, speech change, focal weakness  and weakness.   See hpi  Objective: Vitals:   11/26/16 1118  BP: 118/83  Pulse: 74  Resp: 16  Temp: 98.8 F (37.1 C)  TempSrc: Oral  SpO2: 93%  Weight: (!) 361 lb (163.7 kg)  Height: 6' 9.5" (2.07 m)    Physical Exam General: alert, oriented, in NAD Head: normocephalic, atraumatic, no sinus tenderness Eyes: EOM intact, no scleral icterus or conjunctival injection Ears: TM clear bilaterally Nose: mucosa nonerythematous, nonedematous Throat: no pharyngeal exudate or erythema Lymph: no posterior auricular, submental or cervical lymph adenopathy Heart: normal rate, normal sinus rhythm, no murmurs Lungs: clear to auscultation bilaterally, no wheezing   Assessment and Plan Darryl Morton was seen  today for eye problem, shortness of breath and headache.  Diagnoses and all orders for this visit:  Mild persistent asthma with acute exacerbation- refilled albuterol  And wrote for cpap -     For home use only DME Other see comment -     Cancel: Spirometry with graph  OSA (obstructive sleep apnea)- cpap equipment ordered -     For home use only DME Other see comment  Need for prophylactic vaccination and inoculation against influenza -     Flu Vaccine QUAD 36+ mos IM  Edema of right eyelid-  Trial of tobradex  Tension headache-  Advised rest, hydration and nsaid Other orders -     Cancel: Tdap vaccine greater than or equal to 7yo IM -     tobramycin-dexamethasone (TOBRADEX) ophthalmic solution; Place 1 drop into the right eye every 4 (four) hours while awake. -     albuterol (PROVENTIL HFA;VENTOLIN HFA) 108 (90 Base) MCG/ACT inhaler; Inhale 2 puffs into the lungs every 6 (six) hours as needed for wheezing or shortness of breath.     Darryl Morton A Thursa Emme

## 2016-12-24 ENCOUNTER — Ambulatory Visit: Payer: BLUE CROSS/BLUE SHIELD | Admitting: Family Medicine

## 2016-12-26 ENCOUNTER — Encounter: Payer: Self-pay | Admitting: Family Medicine

## 2016-12-26 ENCOUNTER — Ambulatory Visit (INDEPENDENT_AMBULATORY_CARE_PROVIDER_SITE_OTHER): Payer: BLUE CROSS/BLUE SHIELD | Admitting: Family Medicine

## 2016-12-26 ENCOUNTER — Ambulatory Visit: Payer: BLUE CROSS/BLUE SHIELD | Admitting: Family Medicine

## 2016-12-26 VITALS — BP 122/76 | HR 99 | Temp 98.6°F | Resp 18 | Ht >= 80 in | Wt 363.4 lb

## 2016-12-26 DIAGNOSIS — J4540 Moderate persistent asthma, uncomplicated: Secondary | ICD-10-CM | POA: Insufficient documentation

## 2016-12-26 DIAGNOSIS — F331 Major depressive disorder, recurrent, moderate: Secondary | ICD-10-CM | POA: Diagnosis not present

## 2016-12-26 DIAGNOSIS — G4733 Obstructive sleep apnea (adult) (pediatric): Secondary | ICD-10-CM | POA: Diagnosis not present

## 2016-12-26 DIAGNOSIS — R7303 Prediabetes: Secondary | ICD-10-CM | POA: Insufficient documentation

## 2016-12-26 DIAGNOSIS — Z23 Encounter for immunization: Secondary | ICD-10-CM

## 2016-12-26 MED ORDER — VENLAFAXINE HCL ER 150 MG PO CP24: 150.0000 mg | ORAL_CAPSULE | Freq: Every day | ORAL | 1 refills | Status: DC

## 2016-12-26 MED ORDER — MONTELUKAST SODIUM 10 MG PO TABS: 10.0000 mg | ORAL_TABLET | Freq: Every day | ORAL | 3 refills | Status: DC

## 2016-12-26 NOTE — Assessment & Plan Note (Signed)
Since he seems to be responding more to environmental changes and his spirometry was okay will start with singulair. If he continues to have symptoms then will add flovent at the next visit

## 2016-12-26 NOTE — Assessment & Plan Note (Signed)
Pt advised to take singulair at bedtime and also add antihistamine if needed

## 2016-12-26 NOTE — Assessment & Plan Note (Signed)
Increase venlafaxine to 150mg  and discussed exercise and stress mgmt. 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

## 2016-12-26 NOTE — Patient Instructions (Addendum)
   IF you received an x-ray today, you will receive an invoice from [facility]. Please contact [facility] at [phone] with questions or concerns regarding your invoice.   IF you received labwork today, you will receive an invoice from [facility]. Please contact [facility] at [phone] with questions or concerns regarding your invoice.   Our billing staff will not be able to assist you with questions regarding bills from these companies.  You will be contacted with the lab results as soon as they are available. The fastest way to get your results is to activate your My Chart account. Instructions are located on the last page of this paperwork. If you have not heard from us regarding the results in 2 weeks, please contact this office.    Pneumococcal Polysaccharide Vaccine: What You Need to Know 1. Why get vaccinated? Vaccination can protect older adults (and some children and younger adults) from pneumococcal disease. Pneumococcal disease is caused by bacteria that can spread from person to person through close contact. It can cause ear infections, and it can also lead to more serious infections of the:  Lungs (pneumonia),  Blood (bacteremia), and  Covering of the brain and spinal cord (meningitis). Meningitis can cause deafness and brain damage, and it can be fatal.  Anyone can get pneumococcal disease, but children under 2 years of age, people with certain medical conditions, adults over 65 years of age, and cigarette smokers are at the highest risk. About 18,000 older adults die each year from pneumococcal disease in the [place]. Treatment of pneumococcal infections with penicillin and other drugs used to be more effective. But some strains of the disease have become resistant to these drugs. This makes prevention of the disease, through vaccination, even more important. 2. Pneumococcal polysaccharide vaccine (PPSV23) Pneumococcal polysaccharide vaccine  (PPSV23) protects against 23 types of pneumococcal bacteria. It will not prevent all pneumococcal disease. PPSV23 is recommended for:  All adults 65 years of age and older,  Anyone 2 through 44 years of age with certain long-term health problems,  Anyone 2 through 44 years of age with a weakened immune system,  Adults 19 through 44 years of age who smoke cigarettes or have asthma.  Most people need only one dose of PPSV. A second dose is recommended for certain high-risk groups. People 65 and older should get a dose even if they have gotten one or more doses of the vaccine before they turned 65. Your healthcare provider can give you more information about these recommendations. Most healthy adults develop protection within 2 to 3 weeks of getting the shot. 3. Some people should not get this vaccine  Anyone who has had a life-threatening allergic reaction to PPSV should not get another dose.  Anyone who has a severe allergy to any component of PPSV should not receive it. Tell your provider if you have any severe allergies.  Anyone who is moderately or severely ill when the shot is scheduled may be asked to wait until they recover before getting the vaccine. Someone with a mild illness can usually be vaccinated.  Children less than 2 years of age should not receive this vaccine.  There is no evidence that PPSV is harmful to either a pregnant woman or to her fetus. However, as a precaution, women who need the vaccine should be vaccinated before becoming pregnant, if possible. 4. Risks of a vaccine reaction With any medicine, including vaccines, there is a chance of side effects. These are usually mild   and go away on their own, but serious reactions are also possible. About half of people who get PPSV have mild side effects, such as redness or pain where the shot is given, which go away within about two days. Less than 1 out of 100 people develop a fever, muscle aches, or more severe local  reactions. Problems that could happen after any vaccine:  People sometimes faint after a medical procedure, including vaccination. Sitting or lying down for about 15 minutes can help prevent fainting, and injuries caused by a fall. Tell your doctor if you feel dizzy, or have vision changes or ringing in the ears.  Some people get severe pain in the shoulder and have difficulty moving the arm where a shot was given. This happens very rarely.  Any medication can cause a severe allergic reaction. Such reactions from a vaccine are very rare, estimated at about 1 in a million doses, and would happen within a few minutes to a few hours after the vaccination. As with any medicine, there is a very remote chance of a vaccine causing a serious injury or death. The safety of vaccines is always being monitored. For more information, visit: www.cdc.gov/vaccinesafety/ 5. What if there is a serious reaction? What should I look for? Look for anything that concerns you, such as signs of a severe allergic reaction, very high fever, or unusual behavior. Signs of a severe allergic reaction can include hives, swelling of the face and throat, difficulty breathing, a fast heartbeat, dizziness, and weakness. These would usually start a few minutes to a few hours after the vaccination. What should I do? If you think it is a severe allergic reaction or other emergency that can't wait, call 9-1-1 or get to the nearest hospital. Otherwise, call your doctor. Afterward, the reaction should be reported to the Vaccine Adverse Event Reporting System (VAERS). Your doctor might file this report, or you can do it yourself through the VAERS web site at www.vaers.hhs.gov, or by calling 1-800-822-7967. VAERS does not give medical advice. 6. How can I learn more?  Ask your doctor. He or she can give you the vaccine package insert or suggest other sources of information.  Call your local or state health department.  Contact the  Centers for Disease Control and Prevention (CDC): ? Call 1-800-232-4636 (1-800-CDC-INFO) or ? Visit CDC's website at www.cdc.gov/vaccines CDC Pneumococcal Polysaccharide Vaccine VIS (06/28/13) This information is not intended to replace advice given to you by your health care provider. Make sure you discuss any questions you have with your health care provider. Document Released: 12/19/2005 Document Revised: 11/12/2015 Document Reviewed: 11/12/2015 Elsevier Interactive Patient Education  2017 Elsevier Inc.  

## 2016-12-26 NOTE — Progress Notes (Signed)
Chief Complaint  Patient presents with  . FMLA    discuss   . asthma flare    due allergies    HPI  Moderate persistent asthma Pt reports that he has been getting more asthma exacerbations requiring him to use his rescue inhaler 2-3 times a week.  He reports that he is not sure what the trigger is but thinks it is either his dog or the weather change. He states that he wakes up with night time symptoms He has difficulty using his cpap due to his shortness of breath at night He states that 3 months ago his asthma was about the same.    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 He denies SI He handles claims and customer service with [facility]  Depression screen [facility] 2/9 12/26/2016 11/26/2016 07/30/2016 01/02/2016 12/22/2015  Decreased Interest 3 3 0 2 0  Down, Depressed, Hopeless 2 3 0 3 0  PHQ - 2 Score 5 6 0 5 0  Altered sleeping 3 3 - 3 -  Tired, decreased energy 3 3 - 3 -  Change in appetite - 3 - 2 -  Feeling bad or failure about yourself  0 0 - 0 -  Trouble concentrating 1 1 - 3 -  Moving slowly or fidgety/restless 0 1 - 3 -  Suicidal thoughts 0 0 - 0 -  PHQ-9 Score 12 17 - 19 -  Difficult doing work/chores Very difficult Somewhat difficult - - -   OSA Reports that he has difficulty and with breathing with the cpap due to his night time asthma and congestion He tried saline sprays but does not routinely take allergy medications He is still compliant but has difficulty   Past Medical History:  Diagnosis Date  . Asthma   . Chronic headaches   . Chronic rhinitis 06/30/2011  . Depression   . GERD (gastroesophageal reflux disease) 06/30/2011  . OSA (obstructive sleep apnea) 06/30/2011   PSG 08/05/11>>AHI 32.8, SpO2 82%. CPAP 9 cm H2O>> 9 (mostly central hypopneas), +R, +S     Current Outpatient Prescriptions  Medication Sig Dispense Refill    . albuterol (PROVENTIL HFA;VENTOLIN HFA) 108 (90 Base) MCG/ACT inhaler Inhale 2 puffs into the lungs every 6 (six) hours as needed for wheezing or shortness of breath. 1 Inhaler 2  . pravastatin (PRAVACHOL) 20 MG tablet Take 1 tablet (20 mg total) by mouth daily. 90 tablet 3  . tobramycin-dexamethasone (TOBRADEX) ophthalmic solution Place 1 drop into the right eye every 4 (four) hours while awake. 5 mL 1  . venlafaxine XR (EFFEXOR-XR) 150 MG 24 hr capsule Take 1 capsule (150 mg total) by mouth daily with breakfast. 90 capsule 1  . montelukast (SINGULAIR) 10 MG tablet Take 1 tablet (10 mg total) by mouth at bedtime. 90 tablet 3   No current facility-administered medications for this visit.     Allergies: No Known Allergies  Past Surgical History:  Procedure Laterality Date  . ACHILLES TENDON REPAIR     right  . APPENDECTOMY      Social History   Social History  . Marital status: Divorced    Spouse name: N/A  . Number of children: N/A  . Years of education: N/A   Occupational History  . customer service supervisor    Social History Main Topics  . Smoking status: Never Smoker  .  Smokeless tobacco: Never Used     Comment: exposed to second hand smoke  . Alcohol use No  . Drug use: No  . Sexual activity: Yes   Other Topics Concern  . None   Social History Narrative  . None    Family History  Problem Relation Age of Onset  . Asthma Unknown        father side  . Allergies Unknown        father side     ROS Review of Systems See HPI Constitution: No fevers or chills No malaise No diaphoresis Skin: No rash or itching Eyes: no blurry vision, no double vision GU: no dysuria or hematuria Neuro: no dizziness or headaches * all others reviewed and negative   Objective: Vitals:   12/26/16 0908  BP: 122/76  Pulse: 99  Resp: 18  Temp: 98.6 F (37 C)  TempSrc: Oral  SpO2: 98%  Weight: (!) 363 lb 6.4 oz (164.8 kg)  Height: 6' 9.5" (2.07 m)    Physical  Exam General: alert, oriented, in NAD Head: normocephalic, atraumatic, no sinus tenderness Eyes: EOM intact, no scleral icterus or conjunctival injection Ears: TM clear bilaterally Nose: mucosa nonerythematous, nonedematous Throat: no pharyngeal exudate or erythema Lymph: no posterior auricular, submental or cervical lymph adenopathy Heart: normal rate, normal sinus rhythm, no murmurs Lungs: clear to auscultation bilaterally, no wheezing    Spirometry FEV1 79% of predicted with moderate obstruction  Assessment and Plan Darryl Morton was seen today for fmla and asthma flare.  Diagnoses and all orders for this visit:  Moderate persistent asthma without complication -     Spirometry: Pre & Post Eval  Moderate episode of recurrent major depressive disorder (HCC)  Prediabetes  Need for prophylactic vaccination against Streptococcus pneumoniae (pneumococcus) -     Pneumococcal polysaccharide vaccine 23-valent greater than or equal to 2yo subcutaneous/IM  Other orders -     montelukast (SINGULAIR) 10 MG tablet; Take 1 tablet (10 mg total) by mouth at bedtime. -     venlafaxine XR (EFFEXOR-XR) 150 MG 24 hr capsule; Take 1 capsule (150 mg total) by mouth daily with breakfast.   Problem List Items Addressed This Visit      Respiratory   OSA (obstructive sleep apnea) (Chronic)    Pt advised to take singulair at bedtime and also add antihistamine if needed      Moderate persistent asthma without complication - Primary    Since he seems to be responding more to environmental changes and his spirometry was okay will start with singulair. If he continues to have symptoms then will add flovent at the next visit      Relevant Medications   montelukast (SINGULAIR) 10 MG tablet   Other Relevant Orders   Spirometry: Pre & Post Eval     Other   Moderate episode of recurrent major depressive disorder (HCC)    Increase venlafaxine to 150mg  and discussed exercise and stress mgmt. 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

## 2017-03-15 ENCOUNTER — Encounter: Payer: Self-pay | Admitting: Family Medicine

## 2017-03-15 DIAGNOSIS — G4733 Obstructive sleep apnea (adult) (pediatric): Secondary | ICD-10-CM

## 2017-03-15 DIAGNOSIS — Z9989 Dependence on other enabling machines and devices: Principal | ICD-10-CM

## 2017-03-22 NOTE — Telephone Encounter (Signed)
Prescription printed and handed to the patient

## 2017-04-17 DIAGNOSIS — G4733 Obstructive sleep apnea (adult) (pediatric): Secondary | ICD-10-CM | POA: Diagnosis not present

## 2017-07-23 ENCOUNTER — Other Ambulatory Visit: Payer: Self-pay | Admitting: Family Medicine

## 2017-07-24 NOTE — Telephone Encounter (Signed)
Patient requesting refill, please advise. Thank you 

## 2017-08-09 DIAGNOSIS — Z8379 Family history of other diseases of the digestive system: Secondary | ICD-10-CM | POA: Diagnosis not present

## 2017-08-09 DIAGNOSIS — J209 Acute bronchitis, unspecified: Secondary | ICD-10-CM | POA: Diagnosis not present

## 2017-12-12 ENCOUNTER — Telehealth: Payer: Self-pay | Admitting: Family Medicine

## 2017-12-12 NOTE — Telephone Encounter (Signed)
Called pt. To reschedule appt. On 02/03/18 with Dr. Creta Levin. Left vm to call back. If pt. Calls back please reschedule.

## 2017-12-28 ENCOUNTER — Ambulatory Visit (INDEPENDENT_AMBULATORY_CARE_PROVIDER_SITE_OTHER): Payer: BLUE CROSS/BLUE SHIELD | Admitting: Family Medicine

## 2017-12-28 ENCOUNTER — Other Ambulatory Visit: Payer: Self-pay

## 2017-12-28 ENCOUNTER — Encounter: Payer: Self-pay | Admitting: Family Medicine

## 2017-12-28 ENCOUNTER — Telehealth: Payer: Self-pay | Admitting: Family Medicine

## 2017-12-28 DIAGNOSIS — J4540 Moderate persistent asthma, uncomplicated: Secondary | ICD-10-CM

## 2017-12-28 DIAGNOSIS — Z23 Encounter for immunization: Secondary | ICD-10-CM

## 2017-12-28 DIAGNOSIS — F331 Major depressive disorder, recurrent, moderate: Secondary | ICD-10-CM

## 2017-12-28 DIAGNOSIS — Z0271 Encounter for disability determination: Secondary | ICD-10-CM

## 2017-12-28 DIAGNOSIS — R7303 Prediabetes: Secondary | ICD-10-CM

## 2017-12-28 MED ORDER — ALBUTEROL SULFATE HFA 108 (90 BASE) MCG/ACT IN AERS: 2.0000 | INHALATION_SPRAY | Freq: Four times a day (QID) | RESPIRATORY_TRACT | 2 refills | Status: DC | PRN

## 2017-12-28 MED ORDER — PRAVASTATIN SODIUM 20 MG PO TABS: 20.0000 mg | ORAL_TABLET | Freq: Every day | ORAL | 3 refills | Status: DC

## 2017-12-28 MED ORDER — VENLAFAXINE HCL ER 150 MG PO CP24: 150.0000 mg | ORAL_CAPSULE | Freq: Every day | ORAL | 1 refills | Status: DC

## 2017-12-28 MED ORDER — MONTELUKAST SODIUM 10 MG PO TABS: 10.0000 mg | ORAL_TABLET | Freq: Every day | ORAL | 3 refills | Status: DC

## 2017-12-28 MED ORDER — OMEPRAZOLE 20 MG PO CPDR: 20.0000 mg | DELAYED_RELEASE_CAPSULE | Freq: Every day | ORAL | 3 refills | Status: DC

## 2017-12-28 NOTE — Telephone Encounter (Signed)
See message from [facility], patient was seen in the office today. Please advise thank you.

## 2017-12-28 NOTE — Patient Instructions (Addendum)
   Crock pot beef stews Ground [place] -   Using low carb tortillas (45 calories) Next day use left over ground [place] to make sloppy joes And lettuce wraps  A rotisserie chicken  Use drumsticks with brown rice and veggies Use breast for pulled chicken breast sandwich or slice it for cold cuts Throw the neck, back and bones into broth in a crock pot with noodles and carrots for soup   5 minute meals magazine or book   If you have lab work done today you will be contacted with your lab results within the next 2 weeks.  If you have not heard from [place] then please contact us. The fastest way to get your results is to register for My Chart.   IF you received an x-ray today, you will receive an invoice from [facility]. Please contact [facility] at [phone] with questions or concerns regarding your invoice.   IF you received labwork today, you will receive an invoice from [facility]. Please contact [facility] at [phone] with questions or concerns regarding your invoice.   Our billing staff will not be able to assist you with questions regarding bills from these companies.  You will be contacted with the lab results as soon as they are available. The fastest way to get your results is to activate your My Chart account. Instructions are located on the last page of this paperwork. If you have not heard from [place] regarding the results in 2 weeks, please contact this office.

## 2017-12-28 NOTE — Telephone Encounter (Signed)
BARBARA FROM [facility] [phone]) AND SHE WOULD LIKE IT CLARIFIED IF THIS IS FOR PRE DIABETES OR FOR THE MORBID OBESITY BECAUSE PATIENT WAS NOT TOLD OF PRE DIABETES   THANK YOU

## 2017-12-28 NOTE — Progress Notes (Signed)
Chief Complaint  Patient presents with  . Depression    want to talk about the effexor medication  . Asthma    need refills on all medicaton  . Obesity    need something for weight loss    HPI Morbid Obesity Patient reports that he has been gaining weight He states that he is wondering if there is something that might be triggering his weight gain He has been going to the gym and eating right He has been doing intermittent fasting  He reports that he would stop eating from 9pm to 3pm He states that he would do vegetables or fruits in the morning  At lunch time he would eat a salad   Body mass index is 40.25 kg/m.  Wt Readings from Last 3 Encounters:  12/28/17 (!) 375 lb 9.6 oz (170.4 kg)  12/26/16 (!) 363 lb 6.4 oz (164.8 kg)  11/26/16 (!) 361 lb (163.7 kg)    Asthma Reports that his asthma is doing well He has less wheezing once he is taking his PPI States that he has been having improvement overall States that he has been compliant with his inhaler  Moderate Depression Reports that he misses 1-2 days of his effexor and by the time he notices that he has missed the doses he gets jittery and gets electric like shock Reports that he might miss a dose or two with rushing  Depression screen [facility] 2/9 12/28/2017 12/26/2016 11/26/2016 07/30/2016 01/02/2016  Decreased Interest 0 3 3 0 2  Down, Depressed, Hopeless 0 2 3 0 3  PHQ - 2 Score 0 5 6 0 5  Altered sleeping - 3 3 - 3  Tired, decreased energy - 3 3 - 3  Change in appetite - - 3 - 2  Feeling bad or failure about yourself  - 0 0 - 0  Trouble concentrating - 1 1 - 3  Moving slowly or fidgety/restless - 0 1 - 3  Suicidal thoughts - 0 0 - 0  PHQ-9 Score - 12 17 - 19  Difficult doing work/chores - Very difficult Somewhat difficult - -    Past Medical History:  Diagnosis Date  . Asthma   . Chronic headaches   . Chronic rhinitis 06/30/2011  . Depression   . GERD (gastroesophageal reflux disease) 06/30/2011  . OSA  (obstructive sleep apnea) 06/30/2011   PSG 08/05/11>>AHI 32.8, SpO2 82%. CPAP 9 cm H2O>> 9 (mostly central hypopneas), +R, +S     Current Outpatient Medications  Medication Sig Dispense Refill  . albuterol (PROVENTIL HFA;VENTOLIN HFA) 108 (90 Base) MCG/ACT inhaler Inhale 2 puffs into the lungs every 6 (six) hours as needed for wheezing or shortness of breath. 1 Inhaler 2  . montelukast (SINGULAIR) 10 MG tablet Take 1 tablet (10 mg total) by mouth at bedtime. 90 tablet 3  . omeprazole (PRILOSEC) 20 MG capsule Take 1 capsule (20 mg total) by mouth daily. 90 capsule 3  . pravastatin (PRAVACHOL) 20 MG tablet Take 1 tablet (20 mg total) by mouth daily. 90 tablet 3  . tobramycin-dexamethasone (TOBRADEX) ophthalmic solution Place 1 drop into the right eye every 4 (four) hours while awake. 5 mL 1  . venlafaxine XR (EFFEXOR-XR) 150 MG 24 hr capsule Take 1 capsule (150 mg total) by mouth daily with breakfast. 90 capsule 1   No current facility-administered medications for this visit.     Allergies: No Known Allergies  Past Surgical History:  Procedure Laterality Date  . ACHILLES TENDON REPAIR  right  . APPENDECTOMY      Social History   Socioeconomic History  . Marital status: Married    Spouse name: Not on file  . Number of children: Not on file  . Years of education: Not on file  . Highest education level: Not on file  Occupational History  . Occupation: Producer, television/film/video  Social Needs  . Financial resource strain: Not on file  . Food insecurity:    Worry: Not on file    Inability: Not on file  . Transportation needs:    Medical: Not on file    Non-medical: Not on file  Tobacco Use  . Smoking status: Never Smoker  . Smokeless tobacco: Never Used  . Tobacco comment: exposed to second hand smoke  Substance and Sexual Activity  . Alcohol use: No    Alcohol/week: 0.0 standard drinks  . Drug use: No  . Sexual activity: Yes  Lifestyle  . Physical activity:    Days  per week: Not on file    Minutes per session: Not on file  . Stress: Not on file  Relationships  . Social connections:    Talks on phone: Not on file    Gets together: Not on file    Attends religious service: Not on file    Active member of club or organization: Not on file    Attends meetings of clubs or organizations: Not on file    Relationship status: Not on file  Other Topics Concern  . Not on file  Social History Narrative  . Not on file    Family History  Problem Relation Age of Onset  . Asthma Unknown        father side  . Allergies Unknown        father side     ROS Review of Systems See HPI Constitution: No fevers or chills No malaise No diaphoresis Skin: No rash or itching Eyes: no blurry vision, no double vision GU: no dysuria or hematuria Neuro: no dizziness or headaches all others reviewed and negative   Objective: Vitals:   12/28/17 0918  BP: 128/82  Pulse: 79  Resp: 18  Temp: 98 F (36.7 C)  TempSrc: Oral  SpO2: 96%  Weight: (!) 375 lb 9.6 oz (170.4 kg)  Height: 6\' 9"  (2.057 m)    Physical Exam  Constitutional: He is oriented to person, place, and time. He appears well-developed and well-nourished.  HENT:  Head: Normocephalic and atraumatic.  Eyes: Conjunctivae and EOM are normal.  Neck: Normal range of motion. Neck supple. No thyromegaly present.  Cardiovascular: Normal rate, regular rhythm and normal heart sounds.  No murmur heard. Pulmonary/Chest: Effort normal and breath sounds normal. No stridor. No respiratory distress.  Musculoskeletal: Normal range of motion. He exhibits no edema.  Neurological: He is alert and oriented to person, place, and time. No cranial nerve deficit.  Skin: Skin is warm. Capillary refill takes less than 2 seconds.  Psychiatric: He has a normal mood and affect. His behavior is normal. Judgment and thought content normal.    Assessment and Plan Wallis was seen today for depression, asthma and  obesity.  Diagnoses and all orders for this visit:  Morbid obesity (HCC)- discussed that his calorie restriction is too strict and he is consuming insufficient calories He is also prediabetic thus will refer patients to Diabetic Education  Need for prophylactic vaccination and inoculation against influenza -     Flu Vaccine QUAD 36+ mos IM  Moderate persistent asthma without complication-  continue allergy meds, continue albuterol, singulair and omeprazole  Moderate episode of recurrent major depressive disorder (HCC)- stable on effexor, continue stress mgmt  Other orders -     pravastatin (PRAVACHOL) 20 MG tablet; Take 1 tablet (20 mg total) by mouth daily. -     montelukast (SINGULAIR) 10 MG tablet; Take 1 tablet (10 mg total) by mouth at bedtime. -     omeprazole (PRILOSEC) 20 MG capsule; Take 1 capsule (20 mg total) by mouth daily. -     venlafaxine XR (EFFEXOR-XR) 150 MG 24 hr capsule; Take 1 capsule (150 mg total) by mouth daily with breakfast. -     albuterol (PROVENTIL HFA;VENTOLIN HFA) 108 (90 Base) MCG/ACT inhaler; Inhale 2 puffs into the lungs every 6 (six) hours as needed for wheezing or shortness of breath.     Lendon George A Laelle Bridgett

## 2018-01-01 ENCOUNTER — Encounter: Payer: Self-pay | Admitting: Family Medicine

## 2018-01-01 NOTE — Telephone Encounter (Signed)
I sent the patient a mychart message to clear this up.  He has prediabetes and morbid obesity.  Please move forward with the referral.    Dr. Creta Levin

## 2018-01-02 NOTE — Telephone Encounter (Signed)
Spoke with Britta Mccreedy and pt has appt on 01/12/18 and she advises we need to add the obesity dx, gloria w/ referrals added obesity code of E66.9 to referrral and resent to [facility].   Dgaddy, CMA  FYI

## 2018-01-12 ENCOUNTER — Ambulatory Visit: Payer: BLUE CROSS/BLUE SHIELD | Admitting: Dietician

## 2018-02-03 ENCOUNTER — Ambulatory Visit: Payer: BLUE CROSS/BLUE SHIELD | Admitting: Family Medicine

## 2018-05-24 ENCOUNTER — Other Ambulatory Visit: Payer: Self-pay | Admitting: Family Medicine

## 2018-06-11 ENCOUNTER — Encounter: Payer: Self-pay | Admitting: Family Medicine

## 2018-06-14 ENCOUNTER — Other Ambulatory Visit: Payer: Self-pay

## 2018-06-14 ENCOUNTER — Telehealth (INDEPENDENT_AMBULATORY_CARE_PROVIDER_SITE_OTHER): Payer: BLUE CROSS/BLUE SHIELD | Admitting: Family Medicine

## 2018-06-14 DIAGNOSIS — J309 Allergic rhinitis, unspecified: Secondary | ICD-10-CM

## 2018-06-14 DIAGNOSIS — R058 Other specified cough: Secondary | ICD-10-CM

## 2018-06-14 DIAGNOSIS — R05 Cough: Secondary | ICD-10-CM

## 2018-06-14 DIAGNOSIS — J4540 Moderate persistent asthma, uncomplicated: Secondary | ICD-10-CM

## 2018-06-14 MED ORDER — FLUTICASONE PROPIONATE HFA 110 MCG/ACT IN AERO: 1.0000 | INHALATION_SPRAY | Freq: Two times a day (BID) | RESPIRATORY_TRACT | 5 refills | Status: DC

## 2018-06-14 MED ORDER — PREDNISONE 20 MG PO TABS: 40.0000 mg | ORAL_TABLET | Freq: Every day | ORAL | 0 refills | Status: DC

## 2018-06-14 MED ORDER — ALBUTEROL SULFATE HFA 108 (90 BASE) MCG/ACT IN AERS: 1.0000 | INHALATION_SPRAY | RESPIRATORY_TRACT | 0 refills | Status: DC | PRN

## 2018-06-14 NOTE — Progress Notes (Signed)
Cough and allergies season.

## 2018-06-14 NOTE — Progress Notes (Signed)
Virtual Visit via Video Note  I left message with [name] on 06/14/18 at 4:13 PM  - will try to reach again.  Call attempted again and connected at 4:45 pm.   by a video enabled telemedicine application and verified that I am speaking with the correct person using two identifiers.   I discussed the limitations, risks, security and privacy concerns of performing an evaluation and management service by telephone and the availability of in person appointments. I also discussed with the patient that there may be a patient responsible charge related to this service. The patient expressed understanding and agreed to proceed, consent obtained  Chief complaint:  Asthma   History of Present Illness: Darryl Morton is a 48 y.o. male  PCP: Doristine Bosworth, MD   Asthma: Upper airway cough syndrome versus cough variant asthma per pulmonary eval in 2016.  Treated with Symbicort and Singulair in the past.  Allergy profile in 2016, sinus CT in 2016 visualized paranasal sinuses were clear. Discussed in October 2019, asthma was doing well at that time.  Less wheezing when he was taking his proton pump inhibitor.  Was compliant with Singulair at that time, albuterol as needed  Cough: Started back with cough 3 weeks ago, but wasn't as bad as past week - more cough last week - coughing spells. Improves with drinking water.  Cough seems to be triggered during pollen season, prednisone helped last year.  Some associated wheezing - using albuterol 2 times per day. Helping cough, but returns in about hr -1.5 hrs.  Some reflux, but has been on omeprazole BID. Restarted about 4 weeks ago. Had been off for awhile.  On singulair consistently.  No fever, no bodyaches/myalgias. Does not feel sick otherwise. No known sick contacts, no known exposure to coronavirus infected person. Just started back on flonase 2 days ago.  No chest pains. No new swelling.   Lab Results  Component Value Date   HGBA1C 5.9  (H) 07/30/2016   Home blood sugar reading 126 now. Last ate 5hrs ago. Has meter.    Past Medical History:  Diagnosis Date   Asthma    Chronic headaches    Chronic rhinitis 06/30/2011   Depression    GERD (gastroesophageal reflux disease) 06/30/2011   OSA (obstructive sleep apnea) 06/30/2011   PSG 08/05/11>>AHI 32.8, SpO2 82%. CPAP 9 cm H2O>> 9 (mostly central hypopneas), +R, +S    Past Surgical History:  Procedure Laterality Date   ACHILLES TENDON REPAIR     right   APPENDECTOMY     No Known Allergies Prior to Admission medications   Medication Sig Start Date End Date Taking? Authorizing Provider  albuterol (PROVENTIL HFA;VENTOLIN HFA) 108 (90 Base) MCG/ACT inhaler Inhale 2 puffs into the lungs every 6 (six) hours as needed for wheezing or shortness of breath. 12/28/17  Yes Stallings, Zoe A, MD  montelukast (SINGULAIR) 10 MG tablet Take 1 tablet (10 mg total) by mouth at bedtime. 12/28/17  Yes Doristine Bosworth, MD  omeprazole (PRILOSEC) 20 MG capsule Take 1 capsule (20 mg total) by mouth daily. 12/28/17  Yes Stallings, Zoe A, MD  pravastatin (PRAVACHOL) 20 MG tablet Take 1 tablet (20 mg total) by mouth daily. 12/28/17  Yes Stallings, Zoe A, MD  venlafaxine XR (EFFEXOR-XR) 150 MG 24 hr capsule TAKE 1 CAPSULE (150 MG TOTAL) BY MOUTH DAILY WITH BREAKFAST.MAX ON INSURANCE 05/24/18  Yes Stallings, Zoe A, MD  fluticasone (FLONASE) 50 MCG/ACT nasal spray Place 2 sprays into the  nose daily. 06/30/11 06/14/18 Yes Coralyn Helling, MD  tobramycin-dexamethasone [facility]) ophthalmic solution Place 1 drop into the right eye every 4 (four) hours while awake. Patient not taking: Reported on 06/14/2018 11/26/16   Doristine Bosworth, MD  loratadine (CLARITIN) 10 MG tablet Take 10 mg by mouth daily.  06/28/13  [provider]  ranitidine (ZANTAC) 75 MG tablet Take 75 mg by mouth as needed.  06/28/13  [provider]   Social History   Socioeconomic History   Marital status: Married     Spouse name: Not on file   Number of children: Not on file   Years of education: Not on file   Highest education level: Not on file  Occupational History   Occupation: Producer, television/film/video  Social Needs   Financial resource strain: Not on file   Food insecurity:    Worry: Not on file    Inability: Not on file   Transportation needs:    Medical: Not on file    Non-medical: Not on file  Tobacco Use   Smoking status: Never Smoker   Smokeless tobacco: Never Used   Tobacco comment: exposed to second hand smoke  Substance and Sexual Activity   Alcohol use: No    Alcohol/week: 0.0 standard drinks   Drug use: No   Sexual activity: Yes  Lifestyle   Physical activity:    Days per week: Not on file    Minutes per session: Not on file   Stress: Not on file  Relationships   Social connections:    Talks on phone: Not on file    Gets together: Not on file    Attends religious service: Not on file    Active member of club or organization: Not on file    Attends meetings of clubs or organizations: Not on file    Relationship status: Not on file   Intimate partner violence:    Fear of current or ex partner: Not on file    Emotionally abused: Not on file    Physically abused: Not on file    Forced sexual activity: Not on file  Other Topics Concern   Not on file  Social History Narrative   Not on file    Observations/Objective: On video he is speaking full sentences, no respiratory distress, normal effort.  Assessment and Plan: Moderate persistent asthma, unspecified whether complicated - Plan: fluticasone (FLOVENT HFA) 110 MCG/ACT inhaler, predniSONE (DELTASONE) 20 MG tablet  Upper airway cough syndrome - Plan: predniSONE (DELTASONE) 20 MG tablet  Allergic rhinitis, unspecified seasonality, unspecified trigger   Suspected multifactorial cough including upper airway cough syndrome likely from postnasal drip with allergies as well as irritant from reflux,  along with flare of asthma with allergies/pollen.  No fever, unlikely infection.  Feels well otherwise.  -Continue Flonase, Singulair for allergies  -Prednisone 40 mg daily x3 days, potential side effects discussed including monitoring blood sugar given prior prediabetes level of A1c.  RTC precautions  -Start Flovent for asthma treatment during allergy season, albuterol if needed for breakthrough symptoms  -Continue omeprazole, but handout given on trigger foods  -RTC precautions/ER precautions given.    Follow Up Instructions: Prn.   Patient Instructions  I think the cough may be due to few causes as we discussed, including asthma, upper airway cough syndrome as well as irritation from postnasal drip/allergies.  Flonase should help the allergies. Continue singulair as well.   Continue omeprazole twice per day, but see foods to avoid below  for reflux/heartburn.  Prednisone 2 pills/day for the next 3 days for asthma.  monitor your blood sugars and if those run over 250, let me know.   Can also start Flovent inhaler 1 puff twice per day during allergy season.  Okay to use albuterol up to every 4 hours if needed for wheezing or cough until the prednisone and then Flovent work.  As allergy season calms down and symptoms are stable, can stop the Flovent inhaler.  If you do require albuterol more than once a week in the future, restart Flovent inhaler for asthma control.   If cough is not improving next week, schedule another visit, but let me know if there are questions in the meantime.    Food Choices for Gastroesophageal Reflux Disease, Adult When you have gastroesophageal reflux disease (GERD), the foods you eat and your eating habits are very important. Choosing the right foods can help ease your discomfort. Think about working with a nutrition specialist (dietitian) to help you make good choices. What are tips for following this plan?  Meals  Choose healthy foods that are low in fat,  such as fruits, vegetables, whole grains, low-fat dairy products, and lean meat, fish, and poultry.  Eat small meals often instead of 3 large meals a day. Eat your meals slowly, and in a place where you are relaxed. Avoid bending over or lying down until 2-3 hours after eating.  Avoid eating meals 2-3 hours before bed.  Avoid drinking a lot of liquid with meals.  Cook foods using methods other than frying. Bake, grill, or broil food instead.  Avoid or limit: ? Chocolate. ? Peppermint or spearmint. ? Alcohol. ? Pepper. ? Black and decaffeinated coffee. ? Black and decaffeinated tea. ? Bubbly (carbonated) soft drinks. ? Caffeinated energy drinks and soft drinks.  Limit high-fat foods such as: ? Fatty meat or fried foods. ? Whole milk, cream, butter, or ice cream. ? Nuts and nut butters. ? Pastries, donuts, and sweets made with butter or shortening.  Avoid foods that cause symptoms. These foods may be different for everyone. Common foods that cause symptoms include: ? Tomatoes. ? Oranges, lemons, and limes. ? Peppers. ? Spicy food. ? Onions and garlic. ? Vinegar. Lifestyle  Maintain a healthy weight. Ask your doctor what weight is healthy for you. If you need to lose weight, work with your doctor to do so safely.  Exercise for at least 30 minutes for 5 or more days each week, or as told by your doctor.  Wear loose-fitting clothes.  Do not smoke. If you need help quitting, ask your doctor.  Sleep with the head of your bed higher than your feet. Use a wedge under the mattress or blocks under the bed frame to raise the head of the bed. Summary  When you have gastroesophageal reflux disease (GERD), food and lifestyle choices are very important in easing your symptoms.  Eat small meals often instead of 3 large meals a day. Eat your meals slowly, and in a place where you are relaxed.  Limit high-fat foods such as fatty meat or fried foods.  Avoid bending over or lying down  until 2-3 hours after eating.  Avoid peppermint and spearmint, caffeine, alcohol, and chocolate. This information is not intended to replace advice given to you by your health care provider. Make sure you discuss any questions you have with your health care provider. Document Released: 08/23/2011 Document Revised: 03/29/2016 Document Reviewed: 03/29/2016 Elsevier Interactive Patient Education  2019 Elsevier  Inc.   Cough, Adult  Coughing is a reflex that clears your throat and your airways. Coughing helps to heal and protect your lungs. It is normal to cough occasionally, but a cough that happens with other symptoms or lasts a long time may be a sign of a condition that needs treatment. A cough may last only 2-3 weeks (acute), or it may last longer than 8 weeks (chronic). What are the causes? Coughing is commonly caused by:  Breathing in substances that irritate your lungs.  A viral or bacterial respiratory infection.  Allergies.  Asthma.  Postnasal drip.  Smoking.  Acid backing up from the stomach into the esophagus (gastroesophageal reflux).  Certain medicines.  Chronic lung problems, including COPD (or rarely, lung cancer).  Other medical conditions such as heart failure. Follow these instructions at home: Pay attention to any changes in your symptoms. Take these actions to help with your discomfort:  Take medicines only as told by your health care provider. ? If you were prescribed an antibiotic medicine, take it as told by your health care provider. Do not stop taking the antibiotic even if you start to feel better. ? Talk with your health care provider before you take a cough suppressant medicine.  Drink enough fluid to keep your urine clear or pale yellow.  If the air is dry, use a cold steam vaporizer or humidifier in your bedroom or your home to help loosen secretions.  Avoid anything that causes you to cough at work or at home.  If your cough is worse at night,  try sleeping in a semi-upright position.  Avoid cigarette smoke. If you smoke, quit smoking. If you need help quitting, ask your health care provider.  Avoid caffeine.  Avoid alcohol.  Rest as needed. Contact a health care provider if:  You have new symptoms.  You cough up pus.  Your cough does not get better after 2-3 weeks, or your cough gets worse.  You cannot control your cough with suppressant medicines and you are losing sleep.  You develop pain that is getting worse or pain that is not controlled with pain medicines.  You have a fever.  You have unexplained weight loss.  You have night sweats. Get help right away if:  You cough up blood.  You have difficulty breathing.  Your heartbeat is very fast. This information is not intended to replace advice given to you by your health care provider. Make sure you discuss any questions you have with your health care provider. Document Released: 08/20/2010 Document Revised: 07/30/2015 Document Reviewed: 04/30/2014 Elsevier Interactive Patient Education  2019 [employer]      I discussed the assessment and treatment plan with the patient. The patient was provided an opportunity to ask questions and all were answered. The patient agreed with the plan and demonstrated an understanding of the instructions.   The patient was advised to call back or seek an in-person evaluation if the symptoms worsen or if the condition fails to improve as anticipated.  I provided 18 minutes of non-face-to-face time during this encounter.   Shade FloodJeffrey R Eytan Carrigan, MD

## 2018-06-14 NOTE — Patient Instructions (Addendum)
I think the cough may be due to few causes as we discussed, including asthma, upper airway cough syndrome as well as irritation from postnasal drip/allergies.  Flonase should help the allergies. Continue singulair as well.   Continue omeprazole twice per day, but see foods to avoid below for reflux/heartburn.  Prednisone 2 pills/day for the next 3 days for asthma.  monitor your blood sugars and if those run over 250, let me know.   Can also start Flovent inhaler 1 puff twice per day during allergy season.  Okay to use albuterol up to every 4 hours if needed for wheezing or cough until the prednisone and then Flovent work.  As allergy season calms down and symptoms are stable, can stop the Flovent inhaler.  If you do require albuterol more than once a week in the future, restart Flovent inhaler for asthma control.   If cough is not improving next week, schedule another visit, but let me know if there are questions in the meantime.    Food Choices for Gastroesophageal Reflux Disease, Adult When you have gastroesophageal reflux disease (GERD), the foods you eat and your eating habits are very important. Choosing the right foods can help ease your discomfort. Think about working with a nutrition specialist (dietitian) to help you make good choices. What are tips for following this plan?  Meals  Choose healthy foods that are low in fat, such as fruits, vegetables, whole grains, low-fat dairy products, and lean meat, fish, and poultry.  Eat small meals often instead of 3 large meals a day. Eat your meals slowly, and in a place where you are relaxed. Avoid bending over or lying down until 2-3 hours after eating.  Avoid eating meals 2-3 hours before bed.  Avoid drinking a lot of liquid with meals.  Cook foods using methods other than frying. Bake, grill, or broil food instead.  Avoid or limit: ? Chocolate. ? Peppermint or spearmint. ? Alcohol. ? Pepper. ? Black and decaffeinated coffee. ?  Black and decaffeinated tea. ? Bubbly (carbonated) soft drinks. ? Caffeinated energy drinks and soft drinks.  Limit high-fat foods such as: ? Fatty meat or fried foods. ? Whole milk, cream, butter, or ice cream. ? Nuts and nut butters. ? Pastries, donuts, and sweets made with butter or shortening.  Avoid foods that cause symptoms. These foods may be different for everyone. Common foods that cause symptoms include: ? Tomatoes. ? Oranges, lemons, and limes. ? Peppers. ? Spicy food. ? Onions and garlic. ? Vinegar. Lifestyle  Maintain a healthy weight. Ask your doctor what weight is healthy for you. If you need to lose weight, work with your doctor to do so safely.  Exercise for at least 30 minutes for 5 or more days each week, or as told by your doctor.  Wear loose-fitting clothes.  Do not smoke. If you need help quitting, ask your doctor.  Sleep with the head of your bed higher than your feet. Use a wedge under the mattress or blocks under the bed frame to raise the head of the bed. Summary  When you have gastroesophageal reflux disease (GERD), food and lifestyle choices are very important in easing your symptoms.  Eat small meals often instead of 3 large meals a day. Eat your meals slowly, and in a place where you are relaxed.  Limit high-fat foods such as fatty meat or fried foods.  Avoid bending over or lying down until 2-3 hours after eating.  Avoid peppermint and spearmint, caffeine,  alcohol, and chocolate. This information is not intended to replace advice given to you by your health care provider. Make sure you discuss any questions you have with your health care provider. Document Released: 08/23/2011 Document Revised: 03/29/2016 Document Reviewed: 03/29/2016 Elsevier Interactive Patient Education  2019 Elsevier Inc.   Cough, Adult  Coughing is a reflex that clears your throat and your airways. Coughing helps to heal and protect your lungs. It is normal to cough  occasionally, but a cough that happens with other symptoms or lasts a long time may be a sign of a condition that needs treatment. A cough may last only 2-3 weeks (acute), or it may last longer than 8 weeks (chronic). What are the causes? Coughing is commonly caused by:  Breathing in substances that irritate your lungs.  A viral or bacterial respiratory infection.  Allergies.  Asthma.  Postnasal drip.  Smoking.  Acid backing up from the stomach into the esophagus (gastroesophageal reflux).  Certain medicines.  Chronic lung problems, including COPD (or rarely, lung cancer).  Other medical conditions such as heart failure. Follow these instructions at home: Pay attention to any changes in your symptoms. Take these actions to help with your discomfort:  Take medicines only as told by your health care provider. ? If you were prescribed an antibiotic medicine, take it as told by your health care provider. Do not stop taking the antibiotic even if you start to feel better. ? Talk with your health care provider before you take a cough suppressant medicine.  Drink enough fluid to keep your urine clear or pale yellow.  If the air is dry, use a cold steam vaporizer or humidifier in your bedroom or your home to help loosen secretions.  Avoid anything that causes you to cough at work or at home.  If your cough is worse at night, try sleeping in a semi-upright position.  Avoid cigarette smoke. If you smoke, quit smoking. If you need help quitting, ask your health care provider.  Avoid caffeine.  Avoid alcohol.  Rest as needed. Contact a health care provider if:  You have new symptoms.  You cough up pus.  Your cough does not get better after 2-3 weeks, or your cough gets worse.  You cannot control your cough with suppressant medicines and you are losing sleep.  You develop pain that is getting worse or pain that is not controlled with pain medicines.  You have a fever.   You have unexplained weight loss.  You have night sweats. Get help right away if:  You cough up blood.  You have difficulty breathing.  Your heartbeat is very fast. This information is not intended to replace advice given to you by your health care provider. Make sure you discuss any questions you have with your health care provider. Document Released: 08/20/2010 Document Revised: 07/30/2015 Document Reviewed: 04/30/2014 Elsevier Interactive Patient Education  2019 [employer].

## 2018-07-12 ENCOUNTER — Other Ambulatory Visit: Payer: Self-pay | Admitting: Family Medicine

## 2018-07-12 DIAGNOSIS — J4540 Moderate persistent asthma, uncomplicated: Secondary | ICD-10-CM

## 2018-08-23 ENCOUNTER — Other Ambulatory Visit: Payer: Self-pay | Admitting: Family Medicine

## 2018-08-23 DIAGNOSIS — R030 Elevated blood-pressure reading, without diagnosis of hypertension: Secondary | ICD-10-CM | POA: Diagnosis not present

## 2018-08-23 DIAGNOSIS — Z20828 Contact with and (suspected) exposure to other viral communicable diseases: Secondary | ICD-10-CM | POA: Diagnosis not present

## 2018-08-23 NOTE — Telephone Encounter (Signed)
Forwarding medication refill to PCP for review. 

## 2018-08-27 ENCOUNTER — Telehealth: Payer: Self-pay

## 2018-08-27 ENCOUNTER — Telehealth (INDEPENDENT_AMBULATORY_CARE_PROVIDER_SITE_OTHER): Payer: Self-pay | Admitting: Family Medicine

## 2018-08-27 VITALS — BP 144/90 | Wt 375.0 lb

## 2018-08-27 DIAGNOSIS — Z13220 Encounter for screening for lipoid disorders: Secondary | ICD-10-CM

## 2018-08-27 DIAGNOSIS — K219 Gastro-esophageal reflux disease without esophagitis: Secondary | ICD-10-CM

## 2018-08-27 DIAGNOSIS — R7303 Prediabetes: Secondary | ICD-10-CM

## 2018-08-27 DIAGNOSIS — J4540 Moderate persistent asthma, uncomplicated: Secondary | ICD-10-CM

## 2018-08-27 MED ORDER — OMEPRAZOLE 20 MG PO CPDR: 20.0000 mg | DELAYED_RELEASE_CAPSULE | Freq: Every day | ORAL | 3 refills | Status: AC

## 2018-08-27 MED ORDER — FLOVENT HFA 110 MCG/ACT IN AERO: 1.0000 | INHALATION_SPRAY | Freq: Two times a day (BID) | RESPIRATORY_TRACT | 5 refills | Status: AC

## 2018-08-27 NOTE — Progress Notes (Signed)
CC: Medication refill need on fluticasone inhaler and omeprazole.  Recent bp and weight taken at [facility] noted in vitals.  Pt advises he was exposed to someone with covid-19 that was no asymptomatic.  Pt seen at [facility] on 08/24/2018.   Pt advises he had diarrhea x 3 days but has since subsided x 2 days.  No st or nausea/vomiting but does have cough that causes some sob.  Pt travel to [facility] for the weekend but no travel outside the [place].

## 2018-08-27 NOTE — Patient Instructions (Signed)
° ° ° °  If you have lab work done today you will be contacted with your lab results within the next 2 weeks.  If you have not heard from us then please contact us. The fastest way to get your results is to register for My Chart. ° ° °IF you received an x-ray today, you will receive an invoice from [facility]. Please contact [facility] at [phone] with questions or concerns regarding your invoice.  ° °IF you received labwork today, you will receive an invoice from [facility]. Please contact [facility] at [phone] with questions or concerns regarding your invoice.  ° °Our billing staff will not be able to assist you with questions regarding bills from these companies. ° °You will be contacted with the lab results as soon as they are available. The fastest way to get your results is to activate your My Chart account. Instructions are located on the last page of this paperwork. If you have not heard from us regarding the results in 2 weeks, please contact this office. °  ° ° ° °

## 2018-08-27 NOTE — Telephone Encounter (Signed)
Patient called on both numbers, left VM on cell number to return the call to [phone] between the hours 0700-1900 Monday through Friday to schedule testing as requested by Dr. Nolon Rod.  Patient with exposure to COVID from a household member. Currently asymptomatic. Please set up for testing. Patient and family already self-quarantine.

## 2018-08-27 NOTE — Progress Notes (Signed)
Telemedicine Encounter- SOAP NOTE Established Patient  This telephone encounter was conducted with the patient's (or proxy's) verbal consent via audio telecommunications: yes/no: Yes Patient was instructed to have this encounter in a suitably private space; and to only have persons present to whom they give permission to participate. In addition, patient identity was confirmed by use of name plus two identifiers (DOB and address).  I discussed the limitations, risks, security and privacy concerns of performing an evaluation and management service by telephone and the availability of in person appointments. I also discussed with the patient that there may be a patient responsible charge related to this service. The patient expressed understanding and agreed to proceed.  I spent a total of TIME; 0 MIN TO 60 MIN: 25 minutes talking with the patient or their proxy.  CC: morbid obesity, reflux  Subjective   Darryl Morton is a 48 y.o. established patient. Telephone visit today for  HPI  Morbid obesity Pt reports that he was 375 pounds Wt Readings from Last 3 Encounters:  08/27/18 (!) 375 lb (170.1 kg)  12/28/17 (!) 375 lb 9.6 oz (170.4 kg)  12/26/16 (!) 363 lb 6.4 oz (164.8 kg)   He states that he feels like he has been gaining weight He has been working from home during the pandemic He does exercise regularly.  Reflux  He reports that he had an episode of reflux He had a fish boil and ate crabs and lobster He bought otc omeprazole which is helping   Exposure to covid States that he got his tests at [place] 3 days ago due to exposure to a household contact that may have been exposed covid.  He denies any symptoms outside of diarrhea  Few days ago.       Patient Active Problem List   Diagnosis Date Noted  . Moderate episode of recurrent major depressive disorder ([place]) 12/26/2016  . Prediabetes 12/26/2016  . Moderate persistent asthma without complication 50/27/7412  .  Obesity (BMI 30-39.9) 09/07/2014  . Upper airway cough syndrome vs cough variant asthma 06/30/2011  . Chronic rhinitis 06/30/2011  . Asthma 06/30/2011  . GERD (gastroesophageal reflux disease) 06/30/2011  . OSA (obstructive sleep apnea) 06/30/2011    Past Medical History:  Diagnosis Date  . Asthma   . Chronic headaches   . Chronic rhinitis 06/30/2011  . Depression   . GERD (gastroesophageal reflux disease) 06/30/2011  . OSA (obstructive sleep apnea) 06/30/2011   PSG 08/05/11>>AHI 32.8, SpO2 82%. CPAP 9 cm H2O>> 9 (mostly central hypopneas), +R, +S     Current Outpatient Medications  Medication Sig Dispense Refill  . fluticasone (FLOVENT HFA) 110 MCG/ACT inhaler Inhale 1 puff into the lungs 2 (two) times daily. 1 Inhaler 5  . montelukast (SINGULAIR) 10 MG tablet Take 1 tablet (10 mg total) by mouth at bedtime. 90 tablet 3  . omeprazole (PRILOSEC) 20 MG capsule Take 1 capsule (20 mg total) by mouth daily. 90 capsule 3  . pravastatin (PRAVACHOL) 20 MG tablet Take 1 tablet (20 mg total) by mouth daily. 90 tablet 3  . PROAIR HFA 108 (90 Base) MCG/ACT inhaler TAKE 2 PUFFS BY MOUTH EVERY 6 HOURS AS NEEDED FOR WHEEZE OR SHORTNESS OF BREATH 8.5 Inhaler 2  . venlafaxine XR (EFFEXOR-XR) 150 MG 24 hr capsule TAKE 1 CAPSULE (150 MG TOTAL) BY MOUTH DAILY WITH BREAKFAST.MAX ON INSURANCE 90 capsule 3   No current facility-administered medications for this visit.     No Known Allergies  Social  History   Socioeconomic History  . Marital status: Married    Spouse name: Not on file  . Number of children: Not on file  . Years of education: Not on file  . Highest education level: Not on file  Occupational History  . 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 Financial resource strain: Not on file  . Food insecurity    Worry: Not on file    Inability: Not on file  . Transportation needs    Medical: Not on file    Non-medical: Not on file  Tobacco Use  . Smoking status: Never  Smoker  . Smokeless tobacco: Never Used  . Tobacco comment: exposed to second hand smoke  Substance and Sexual Activity  . Alcohol use: No    Alcohol/week: 0.0 standard drinks  . Drug use: No  . Sexual activity: Yes  Lifestyle  . Physical activity    Days per week: Not on file    Minutes per session: Not on file  . Stress: Not on file  Relationships  . Social Musicianconnections    Talks on phone: Not on file    Gets together: Not on file    Attends religious service: Not on file    Active member of club or organization: Not on file    Attends meetings of clubs or organizations: Not on file    Relationship status: Not on file  . Intimate partner violence    Fear of current or ex partner: Not on file    Emotionally abused: Not on file    Physically abused: Not on file    Forced sexual activity: Not on file  Other Topics Concern  . Not on file  Social History Narrative  . Not on file    ROS Review of Systems  Constitutional: Negative for activity change, appetite change, chills and fever.  HENT: Negative for congestion, nosebleeds, trouble swallowing and voice change.   Respiratory: Negative for cough, shortness of breath and wheezing.   Gastrointestinal: Negative for diarrhea, nausea and vomiting.  Genitourinary: Negative for difficulty urinating, dysuria, flank pain and hematuria.  Musculoskeletal: Negative for back pain, joint swelling and neck pain.  Neurological: Negative for dizziness, speech difficulty, light-headedness and numbness.  See HPI. All other review of systems negative.   Objective  Unable to perform due to telephone visit  Vitals as reported by the patient: Today's Vitals   08/27/18 1447  BP: (!) 144/90  Weight: (!) 375 lb (170.1 kg)    Diagnoses and all orders for this visit:  Prediabetes -  Will monitor A1c<6% -     Comprehensive metabolic panel; Future -     Hemoglobin A1c; Future  Screening cholesterol level -     Lipid panel; Future  Morbid  obesity (HCC)- weight stable, advised increasing exercise to bid  Moderate persistent asthma, unspecified whether complicated- stable on current meds -     fluticasone (FLOVENT HFA) 110 MCG/ACT inhaler; Inhale 1 puff into the lungs 2 (two) times daily.  Gastroesophageal reflux disease without esophagitis- continue PPI  Other orders -     omeprazole (PRILOSEC) 20 MG capsule; Take 1 capsule (20 mg total) by mouth daily.     I discussed the assessment and treatment plan with the patient. The patient was provided an opportunity to ask questions and all were answered. The patient agreed with the plan and demonstrated an understanding of the instructions.   The patient was advised to call back or seek  an in-person evaluation if the symptoms worsen or if the condition fails to improve as anticipated.  I provided 25 minutes of non-face-to-face time during this encounter.  Forrest Moron, MD  [facility] at [facility]

## 2018-08-28 ENCOUNTER — Encounter: Payer: Self-pay | Admitting: *Deleted

## 2018-08-28 NOTE — Progress Notes (Signed)
Phone call to patient, left voicemail for patient to return call regarding scheduling COVID 19 test.

## 2018-08-28 NOTE — Progress Notes (Signed)
Darryl Moron, MD  [facility] Testing Pool 16 hours ago (4:09 PM)   Patient with exposure to COVID from a household member. Currently asymptomatic. Please set up for testing. Patient and family already self-quarantine.

## 2018-09-10 ENCOUNTER — Encounter: Payer: Self-pay | Admitting: Family Medicine

## 2018-09-27 NOTE — Telephone Encounter (Signed)
Pt called for an update on his FMLA paperwork. Pt requests call back.

## 2018-10-05 ENCOUNTER — Encounter: Payer: Self-pay | Admitting: Family Medicine

## 2018-10-29 ENCOUNTER — Telehealth: Payer: Self-pay

## 2018-10-29 NOTE — Telephone Encounter (Signed)
Left message on voicemail to return office call. (Reason for call:  Pt fmla paperwork completed and ready for p/u.  Unable to fax back as pt portion of the forms is not complete.) Dgaddy, CMA

## 2018-11-07 ENCOUNTER — Telehealth: Payer: Self-pay | Admitting: Family Medicine

## 2018-11-07 NOTE — Telephone Encounter (Signed)
Patient wants to know if he can get the portion of the [facility] paperwork sent through [facility] and if not if you will give him a call to come in and fill it out [phone]

## 2018-11-16 ENCOUNTER — Telehealth: Payer: Self-pay | Admitting: Family Medicine

## 2018-11-16 NOTE — Telephone Encounter (Signed)
Darryl Morton called to say he will be in on Monday to fill out his portion of FMLA paperwork . FR

## 2018-11-16 NOTE — Telephone Encounter (Signed)
Left message on voicemail to return office call.  Pt will need to come in to complete his portion of the FMLA paperwork we are not permitted to send pt fmla paperwork thru mychart.

## 2018-11-20 NOTE — Telephone Encounter (Signed)
Pt in today to complete the employee portion of form so it may be faxed to his company.  Yoseline helped assist pt with this matter.

## 2018-12-02 ENCOUNTER — Other Ambulatory Visit: Payer: Self-pay | Admitting: Family Medicine

## 2018-12-02 NOTE — Telephone Encounter (Signed)
Forwarding medication refill request to the clinical pool for review. 

## 2019-01-07 ENCOUNTER — Ambulatory Visit: Payer: Self-pay | Admitting: Family Medicine

## 2019-01-18 ENCOUNTER — Ambulatory Visit (INDEPENDENT_AMBULATORY_CARE_PROVIDER_SITE_OTHER): Payer: BC Managed Care – PPO | Admitting: Family Medicine

## 2019-01-18 ENCOUNTER — Encounter: Payer: Self-pay | Admitting: Family Medicine

## 2019-01-18 ENCOUNTER — Other Ambulatory Visit: Payer: Self-pay

## 2019-01-18 VITALS — BP 135/67 | HR 80 | Temp 98.9°F | Ht >= 80 in | Wt 381.0 lb

## 2019-01-18 DIAGNOSIS — Z13220 Encounter for screening for lipoid disorders: Secondary | ICD-10-CM

## 2019-01-18 DIAGNOSIS — J4540 Moderate persistent asthma, uncomplicated: Secondary | ICD-10-CM | POA: Diagnosis not present

## 2019-01-18 DIAGNOSIS — J4541 Moderate persistent asthma with (acute) exacerbation: Secondary | ICD-10-CM | POA: Diagnosis not present

## 2019-01-18 DIAGNOSIS — R7303 Prediabetes: Secondary | ICD-10-CM

## 2019-01-18 DIAGNOSIS — B351 Tinea unguium: Secondary | ICD-10-CM

## 2019-01-18 MED ORDER — PREDNISONE 20 MG PO TABS: 40.0000 mg | ORAL_TABLET | Freq: Every day | ORAL | 0 refills | Status: DC

## 2019-01-18 MED ORDER — BENZONATATE 200 MG PO CAPS: 200.0000 mg | ORAL_CAPSULE | Freq: Two times a day (BID) | ORAL | 3 refills | Status: AC | PRN

## 2019-01-18 MED ORDER — TERBINAFINE HCL 250 MG PO TABS: 250.0000 mg | ORAL_TABLET | Freq: Every day | ORAL | 1 refills | Status: AC

## 2019-01-18 NOTE — Patient Instructions (Addendum)
715 L/min Estimated Peak Flow (see Evidence for equation used)   502 L/min Lower limit of normal peak flow     If you have lab work done today you will be contacted with your lab results within the next 2 weeks.  If you have not heard from us then please contact us. The fastest way to get your results is to register for My Chart.   IF you received an x-ray today, you will receive an invoice from [facility]. Please contact [facility] at [phone] with questions or concerns regarding your invoice.   IF you received labwork today, you will receive an invoice from [facility]. Please contact [facility] at [phone] with questions or concerns regarding your invoice.   Our billing staff will not be able to assist you with questions regarding bills from these companies.  You will be contacted with the lab results as soon as they are available. The fastest way to get your results is to activate your My Chart account. Instructions are located on the last page of this paperwork. If you have not heard from us regarding the results in 2 weeks, please contact this office.      Peak Flow Meter  A peak flow meter is a device that helps you determine how well your asthma is being controlled. The device measures the flow of air out of your lungs. This is a simple but important tool in daily asthma management. Peak flow meters are available over the counter. The readings from the meter will help you and your health care provider:  Determine the severity of your asthma.  Evaluate the effectiveness of your current treatment.  Determine when to add or stop certain medicines.  Recognize an asthma attack before signs or symptoms appear.  Decide when to seek emergency care. What are the risks?  Dizziness. Breathing too quickly into the peak flow meter may cause dizziness. This could cause you to pass out. Take your time so you do not get dizzy or light-headed.  False readings.  Not cleaning your meter may cause false results. You may not know that your asthma is getting better or getting worse, making you to start or stop treatment improperly. How to find your personal best Your "personal best" is the highest peak flow rate you can reach when you feel good and have no asthma symptoms. This can be used as your standard for comparing your peak flow meter readings. Because everyone's asthma is different, your personal best will be unique to you. Your health care provider will help you to figure out your personal best. Typically, you will take readings once or twice a day for 2 weeks when you are not having symptoms. The highest reading during the trial period is your personal best. Because your lung function can change over time, your personal best should be measured each year. How to use your peak flow meter 1. Move the upper marker to the number that is your personal best. 2. Move the lower marker to the bottom of the numbered scale. 3. Connect the mouthpiece to the peak flow meter. 4. Stand up. 5. Take a deep breath. Make sure you fill your lungs completely. 6. Place your lips tightly around the mouthpiece. Blow as hard and as fast as you can with a single breath (forced exhalation), as if you are blowing out candles. If your lips are not placed tightly around the mouthpiece of the peak flow meter, you will get incorrect low readings. 7. At  the end of your forced exhale, check to see what number the lower marker landed on. This is your peak flow rate. 8. Move the lower marker back to the bottom of the numbered scale. 9. Repeat these steps 2 more times. Record the highest reading of the 3 tries in your asthma diary. Do not calculate the average for your 3 tries, just record the highest. Always write down the results in your asthma diary. After using your peak flow meter, rest and breathe slowly and easily. Keep a record of your progress. Your health care provider can provide  you with a simple table to help with this. For the most accurate readings, it is important to keep your peak flow meter clean. Follow the manufacturer's instructions on how to take care of your peak flow meter. Your health care provider will give you instructions on when to do regular monitoring. You may also need to check your peak flow when:  Coughing, wheezing, or other asthma symptoms wake you up at night.  Your asthma symptoms worsen during the day.  Your breathing is made worse because of a cold, flu, or other respiratory illness.  You need to use your quick-relief, "rescue medicine." It is best to check your peak flow rate before you use rescue medicine, and then 20-30 minutes afterward. This will help determine whether you need to take the rescue medicine again. How to use your results Use color-coded zones on the meter to see how your peak flow rate compares to your personal best. If your peak flow readings fall too far below your personal best into the yellow or red zone, you will need to take action to prevent or minimize an asthma attack. The color code for each zone reflects progressively more severe symptoms: Green zone: Good   Peak flow rate between 80% and 100% of your personal best. Your asthma is under good control when your peak flow rate is in the green zone.  When you are in the green zone, you are not likely to be experiencing any asthma symptoms.  Only take your regular, preventive medicine. Your rescue medicine is not required. Yellow zone: Caution   Peak flow rate between 50% and 80% of your personal best. Your asthma is getting worse and could be improved.  You may have begun coughing, wheezing, or feeling chest tightness. Sometimes peak flow rates dip down into the yellow zone before asthma symptoms appear.  Consider increasing or changing your asthma medicine. This may include using your rescue medicine. If you have an asthma action plan, follow each step listed  for the yellow zone, including medicine changes. Red zone: Warning   Peak flow rate below 50% of your personal best. This may mean you are having, or are at risk of having, a medical emergency.  Your coughing, wheezing, or shortness of breath may have become severe. Do not wait to use your rescue medicine. Stop whatever you are doing and use your rescue inhaler, nebulizer, or other medicines to open your airways.  If you have an asthma action plan, follow each step listed for the red zone, including medicine changes and when to seek emergency medical care. Contact a health care provider if: You are in the yellow zone. If you have an asthma action plan, follow all of the steps listed under the yellow zone section of the plan. Let your health care provider know that you are in the yellow zone. Get help right away if: You are in the red  zone. If you have an asthma action plan, follow all of the steps listed under the red zone section of the plan while you are seeking immediate medical care. Summary  A peak flow meter is a device that helps you determine how well your asthma is being controlled. The device measures the flow of air out of your lungs.  Readings from the meter will help you determine the severity of your asthma, whether your treatment is working, and when to start or stop treatment.  Measure your personal best every year during periods of no symptoms. The meter will compare this reading to your regular readings to determine your condition at a given time.  Work with your health care provider to understand what each zone (green, yellow, red) means and what actions to take in each zone. This information is not intended to replace advice given to you by your health care provider. Make sure you discuss any questions you have with your health care provider. Document Released: 12/19/2006 Document Revised: 02/03/2017 Document Reviewed: 02/18/2016 Elsevier Patient Education  2020 [employer].

## 2019-01-18 NOTE — Progress Notes (Signed)
Established Patient Office Visit  Subjective:  Patient ID: Darryl Morton, male    DOB: 03-Jul-1970  Age: 48 y.o. MRN: [id number]  CC:  Chief Complaint  Patient presents with  . feet hurt    right big toe nail fungus   . sesonal cough  . Allergies    HPI Tahsin Benyo presents for    He reports that he has postnasal drip that cause a cough  He is coughing throughout the day and then the cough causes him to get short of breath He is taking dayquil for cough which helps him for about 20-30 minutes He is using his inhaler albuterol 2-3 times a day He uses his controller bid as instructed  This all started up when the seasons changed. He reports that he had a close contact with COVID  He states that the allergies and asthma causing the cough and wheezing is disrupting his life.   Wt Readings from Last 3 Encounters:  01/18/19 (!) 381 lb (172.8 kg)  08/27/18 (!) 375 lb (170.1 kg)  12/28/17 (!) 375 lb 9.6 oz (170.4 kg)     Past Medical History:  Diagnosis Date  . Asthma   . Chronic headaches   . Chronic rhinitis 06/30/2011  . Depression   . GERD (gastroesophageal reflux disease) 06/30/2011  . OSA (obstructive sleep apnea) 06/30/2011   PSG 08/05/11>>AHI 32.8, SpO2 82%. CPAP 9 cm H2O>> 9 (mostly central hypopneas), +R, +S     Past Surgical History:  Procedure Laterality Date  . ACHILLES TENDON REPAIR     right  . APPENDECTOMY      Family History  Problem Relation Age of Onset  . Asthma Unknown        father side  . Allergies Unknown        father side    Social History   Socioeconomic History  . Marital status: Married    Spouse name: Not on file  . Number of children: Not on file  . Years of education: Not on file  . Highest education level: Not on file  Occupational History  . Occupation: Producer, television/film/video  Social Needs  . Financial resource strain: Not on file  . Food insecurity    Worry: Not on file    Inability: Not on file  .  Transportation needs    Medical: Not on file    Non-medical: Not on file  Tobacco Use  . Smoking status: Never Smoker  . Smokeless tobacco: Never Used  . Tobacco comment: exposed to second hand smoke  Substance and Sexual Activity  . Alcohol use: No    Alcohol/week: 0.0 standard drinks  . Drug use: No  . Sexual activity: Yes  Lifestyle  . Physical activity    Days per week: Not on file    Minutes per session: Not on file  . Stress: Not on file  Relationships  . Social Musician on phone: Not on file    Gets together: Not on file    Attends religious service: Not on file    Active member of club or organization: Not on file    Attends meetings of clubs or organizations: Not on file    Relationship status: Not on file  . Intimate partner violence    Fear of current or ex partner: Not on file    Emotionally abused: Not on file    Physically abused: Not on file    Forced sexual activity: Not  on file  Other Topics Concern  . Not on file  Social History Narrative  . Not on file    Outpatient Medications Prior to Visit  Medication Sig Dispense Refill  . fluticasone (FLOVENT HFA) 110 MCG/ACT inhaler Inhale 1 puff into the lungs 2 (two) times daily. 1 Inhaler 5  . montelukast (SINGULAIR) 10 MG tablet TAKE 1 TABLET BY MOUTH EVERYDAY AT BEDTIME.MAX ON INSURANCE 90 tablet 3  . omeprazole (PRILOSEC) 20 MG capsule Take 1 capsule (20 mg total) by mouth daily. 90 capsule 3  . pravastatin (PRAVACHOL) 20 MG tablet TAKE 1 TABLET BY MOUTH EVERY DAY.MAX ON INSURANCE 90 tablet 3  . PROAIR HFA 108 (90 Base) MCG/ACT inhaler TAKE 2 PUFFS BY MOUTH EVERY 6 HOURS AS NEEDED FOR WHEEZE OR SHORTNESS OF BREATH 8.5 Inhaler 2  . venlafaxine XR (EFFEXOR-XR) 150 MG 24 hr capsule TAKE 1 CAPSULE (150 MG TOTAL) BY MOUTH DAILY WITH BREAKFAST.MAX ON INSURANCE 90 capsule 3   No facility-administered medications prior to visit.     No Known Allergies  ROS Review of Systems Review of Systems   Constitutional: Negative for activity change, appetite change, chills and fever.  HENT: Negative for congestion, nosebleeds, trouble swallowing and voice change.   Respiratory: Negative for cough, shortness of breath and wheezing.   Gastrointestinal: Negative for diarrhea, nausea and vomiting.  Genitourinary: Negative for difficulty urinating, dysuria, flank pain and hematuria.  Musculoskeletal: Negative for back pain, joint swelling and neck pain.  Neurological: Negative for dizziness, speech difficulty, light-headedness and numbness.  See HPI. All other review of systems negative.     Objective:    Physical Exam  BP 135/67   Pulse 80   Temp 98.9 F (37.2 C) (Oral)   Ht 6' 9.5" (2.07 m)   Wt (!) 381 lb (172.8 kg)   SpO2 93%   BMI 40.33 kg/m  Wt Readings from Last 3 Encounters:  01/18/19 (!) 381 lb (172.8 kg)  08/27/18 (!) 375 lb (170.1 kg)  12/28/17 (!) 375 lb 9.6 oz (170.4 kg)   Physical Exam  Constitutional: Oriented to person, place, and time. Appears well-developed and well-nourished.  HENT:  Head: Normocephalic and atraumatic.  Eyes: Conjunctivae and EOM are normal.  Cardiovascular: Normal rate, regular rhythm, normal heart sounds and intact distal pulses.  No murmur heard. Pulmonary/Chest: Effort normal and breath sounds normal. No stridor. No respiratory distress. Has no wheezes.  Neurological: Is alert and oriented to person, place, and time.  Skin: Skin is warm. Capillary refill takes less than 2 seconds.  Psychiatric: Has a normal mood and affect. Behavior is normal. Judgment and thought content normal.  Foot: right great toe with thickened nails.  Health Maintenance Due  Topic Date Due  . HIV Screening  09/10/1987  . INFLUENZA VACCINE  10/06/2018    There are no preventive care reminders to display for this patient.  Lab Results  Component Value Date   TSH 1.030 07/30/2016   Lab Results  Component Value Date   WBC 3.7 07/30/2016   HGB 15.5  07/30/2016   HCT 44.7 07/30/2016   MCV 90 07/30/2016   PLT 237 07/30/2016   Lab Results  Component Value Date   NA 140 01/18/2019   K 4.9 01/18/2019   CO2 20 01/18/2019   GLUCOSE 101 (H) 01/18/2019   BUN 10 01/18/2019   CREATININE 1.32 (H) 01/18/2019   BILITOT 0.3 01/18/2019   ALKPHOS 85 01/18/2019   AST 21 01/18/2019   ALT 29  01/18/2019   PROT 7.2 01/18/2019   ALBUMIN 4.5 01/18/2019   CALCIUM 9.5 01/18/2019   Lab Results  Component Value Date   CHOL 228 (H) 01/18/2019   Lab Results  Component Value Date   HDL 39 (L) 01/18/2019   Lab Results  Component Value Date   LDLCALC 165 (H) 01/18/2019   Lab Results  Component Value Date   TRIG 134 01/18/2019   Lab Results  Component Value Date   CHOLHDL 5.8 (H) 01/18/2019   Lab Results  Component Value Date   HGBA1C 6.4 (H) 01/18/2019      Assessment & Plan:   Problem List Items Addressed This Visit      Respiratory   Moderate persistent asthma without complication - Primary Advised prednisone and tessalon Continue with your current meds for asthma   Relevant Medications   benzonatate (TESSALON) 200 MG capsule   predniSONE (DELTASONE) 20 MG tablet     Other   Prediabetes- discussed that he is very close to diabetes thus he should work on weight loss Lab Results  Component Value Date   HGBA1C 6.4 (H) 01/18/2019       Other Visit Diagnoses    Nail fungus    -  Using lamisil     Relevant Medications   terbinafine (LAMISIL) 250 MG tablet   Moderate persistent asthma with exacerbation       Relevant Medications   predniSONE (DELTASONE) 20 MG tablet   Screening cholesterol level          Meds ordered this encounter  Medications  . benzonatate (TESSALON) 200 MG capsule    Sig: Take 1 capsule (200 mg total) by mouth 2 (two) times daily as needed for cough.    Dispense:  20 capsule    Refill:  3  . predniSONE (DELTASONE) 20 MG tablet    Sig: Take 2 tablets (40 mg total) by mouth daily with breakfast.     Dispense:  10 tablet    Refill:  0  . terbinafine (LAMISIL) 250 MG tablet    Sig: Take 1 tablet (250 mg total) by mouth daily.    Dispense:  90 tablet    Refill:  1    Follow-up: No follow-ups on file.    Forrest Moron, MD

## 2019-01-19 LAB — LIPID PANEL
Chol/HDL Ratio: 5.8 ratio — ABNORMAL HIGH (ref 0.0–5.0)
Cholesterol, Total: 228 mg/dL — ABNORMAL HIGH (ref 100–199)
HDL: 39 mg/dL — ABNORMAL LOW (ref >39)
LDL Chol Calc (NIH): 165 mg/dL — ABNORMAL HIGH (ref 0–99)
Triglycerides: 134 mg/dL (ref 0–149)
VLDL Cholesterol Cal: 24 mg/dL (ref 5–40)

## 2019-01-19 LAB — HEMOGLOBIN A1C
Est. average glucose Bld gHb Est-mCnc: 137 mg/dL
Hgb A1c MFr Bld: 6.4 % — ABNORMAL HIGH (ref 4.8–5.6)

## 2019-01-19 LAB — COMPREHENSIVE METABOLIC PANEL
ALT: 29 IU/L (ref 0–44)
AST: 21 IU/L (ref 0–40)
Albumin/Globulin Ratio: 1.7 (ref 1.2–2.2)
Albumin: 4.5 g/dL (ref 4.0–5.0)
Alkaline Phosphatase: 85 IU/L (ref 39–117)
BUN/Creatinine Ratio: 8 — ABNORMAL LOW (ref 9–20)
BUN: 10 mg/dL (ref 6–24)
Bilirubin Total: 0.3 mg/dL (ref 0.0–1.2)
CO2: 20 mmol/L (ref 20–29)
Calcium: 9.5 mg/dL (ref 8.7–10.2)
Chloride: 102 mmol/L (ref 96–106)
Creatinine, Ser: 1.32 mg/dL — ABNORMAL HIGH (ref 0.76–1.27)
GFR calc Af Amer: 74 mL/min/{1.73_m2} (ref >59)
GFR calc non Af Amer: 64 mL/min/{1.73_m2} (ref >59)
Globulin, Total: 2.7 g/dL (ref 1.5–4.5)
Glucose: 101 mg/dL — ABNORMAL HIGH (ref 65–99)
Potassium: 4.9 mmol/L (ref 3.5–5.2)
Sodium: 140 mmol/L (ref 134–144)
Total Protein: 7.2 g/dL (ref 6.0–8.5)

## 2019-01-22 ENCOUNTER — Encounter: Payer: Self-pay | Admitting: Family Medicine

## 2019-03-15 ENCOUNTER — Ambulatory Visit: Payer: BC Managed Care – PPO | Admitting: Registered Nurse

## 2019-03-15 ENCOUNTER — Other Ambulatory Visit: Payer: Self-pay

## 2019-03-15 ENCOUNTER — Encounter: Payer: Self-pay | Admitting: Registered Nurse

## 2019-03-15 VITALS — BP 140/86 | HR 90 | Temp 97.8°F | Resp 17 | Ht >= 80 in | Wt 379.2 lb

## 2019-03-15 DIAGNOSIS — Z23 Encounter for immunization: Secondary | ICD-10-CM

## 2019-03-15 DIAGNOSIS — K439 Ventral hernia without obstruction or gangrene: Secondary | ICD-10-CM

## 2019-03-15 NOTE — Patient Instructions (Signed)
° ° ° °  If you have lab work done today you will be contacted with your lab results within the next 2 weeks.  If you have not heard from us then please contact us. The fastest way to get your results is to register for My Chart. ° ° °IF you received an x-ray today, you will receive an invoice from [facility]. Please contact [facility] at [phone] with questions or concerns regarding your invoice.  ° °IF you received labwork today, you will receive an invoice from [facility]. Please contact [facility] at [phone] with questions or concerns regarding your invoice.  ° °Our billing staff will not be able to assist you with questions regarding bills from these companies. ° °You will be contacted with the lab results as soon as they are available. The fastest way to get your results is to activate your My Chart account. Instructions are located on the last page of this paperwork. If you have not heard from us regarding the results in 2 weeks, please contact this office. °  ° ° ° °

## 2019-03-15 NOTE — Progress Notes (Signed)
Established Patient Office Visit  Subjective:  Patient ID: Darryl Morton, male    DOB: [birth year]  Age: 49 y.o. MRN: [id number]  CC:  Chief Complaint  Patient presents with  . Hernia    patient states he have a hernia on stomach and when he works out or when he walks it is irritaing/painful. The issue startesd three weeks ago per patient it does not bother him if he just sits still.    HPI Darryl Heffern presents for concern for hernia  Has known that he has a ventral hernia but over the past 3 weeks has been particularly irritating.  He is working out and dieting to try to lose weight, but states he cannot work out too much without the hernia becoming painful and irritated. Takes tylenol for this with some effect, but curious about what more he can do. Denies changes to bowel movement frequency. Denies blood in stool. No signs of strangulated bowel at this time. Also interested in discussing dieting for weight loss  Past Medical History:  Diagnosis Date  . Asthma   . Chronic headaches   . Chronic rhinitis 06/30/2011  . Depression   . GERD (gastroesophageal reflux disease) 06/30/2011  . OSA (obstructive sleep apnea) 06/30/2011   PSG 08/05/11>>AHI 32.8, SpO2 82%. CPAP 9 cm H2O>> 9 (mostly central hypopneas), +R, +S     Past Surgical History:  Procedure Laterality Date  . ACHILLES TENDON REPAIR     right  . APPENDECTOMY      Family History  Problem Relation Age of Onset  . Asthma Unknown        father side  . Allergies Unknown        father side    Social History   Socioeconomic History  . Marital status: Married    Spouse name: Not on file  . Number of children: Not on file  . Years of education: Not on file  . Highest education level: Not on file  Occupational History  . Occupation: Therapist, art  Tobacco Use  . Smoking status: Never Smoker  . Smokeless tobacco: Never Used  . Tobacco comment: exposed to second hand smoke  Substance and Sexual  Activity  . Alcohol use: No    Alcohol/week: 0.0 standard drinks  . Drug use: No  . Sexual activity: Yes  Other Topics Concern  . Not on file  Social History Narrative  . Not on file   Social Determinants of Health   Financial Resource Strain:   . Difficulty of Paying Living Expenses: Not on file  Food Insecurity:   . Worried About Charity fundraiser in the Last Year: Not on file  . Ran Out of Food in the Last Year: Not on file  Transportation Needs:   . Lack of Transportation (Medical): Not on file  . Lack of Transportation (Non-Medical): Not on file  Physical Activity:   . Days of Exercise per Week: Not on file  . Minutes of Exercise per Session: Not on file  Stress:   . Feeling of Stress : Not on file  Social Connections:   . Frequency of Communication with Friends and Family: Not on file  . Frequency of Social Gatherings with Friends and Family: Not on file  . Attends Religious Services: Not on file  . Active Member of Clubs or Organizations: Not on file  . Attends Archivist Meetings: Not on file  . Marital Status: Not on file  Intimate Partner Violence:   .  Fear of Current or Ex-Partner: Not on file  . Emotionally Abused: Not on file  . Physically Abused: Not on file  . Sexually Abused: Not on file    Outpatient Medications Prior to Visit  Medication Sig Dispense Refill  . benzonatate (TESSALON) 200 MG capsule Take 1 capsule (200 mg total) by mouth 2 (two) times daily as needed for cough. 20 capsule 3  . montelukast (SINGULAIR) 10 MG tablet TAKE 1 TABLET BY MOUTH EVERYDAY AT BEDTIME.MAX ON INSURANCE 90 tablet 3  . omeprazole (PRILOSEC) 20 MG capsule Take 1 capsule (20 mg total) by mouth daily. 90 capsule 3  . pravastatin (PRAVACHOL) 20 MG tablet TAKE 1 TABLET BY MOUTH EVERY DAY.MAX ON INSURANCE 90 tablet 3  . predniSONE (DELTASONE) 20 MG tablet Take 2 tablets (40 mg total) by mouth daily with breakfast. 10 tablet 0  . PROAIR HFA 108 (90 Base) MCG/ACT  inhaler TAKE 2 PUFFS BY MOUTH EVERY 6 HOURS AS NEEDED FOR WHEEZE OR SHORTNESS OF BREATH 8.5 Inhaler 2  . terbinafine (LAMISIL) 250 MG tablet Take 1 tablet (250 mg total) by mouth daily. 90 tablet 1  . venlafaxine XR (EFFEXOR-XR) 150 MG 24 hr capsule TAKE 1 CAPSULE (150 MG TOTAL) BY MOUTH DAILY WITH BREAKFAST.MAX ON INSURANCE 90 capsule 3  . fluticasone (FLOVENT HFA) 110 MCG/ACT inhaler Inhale 1 puff into the lungs 2 (two) times daily. (Patient not taking: Reported on 03/15/2019) 1 Inhaler 5   No facility-administered medications prior to visit.    No Known Allergies  ROS Review of Systems  Constitutional: Negative.   HENT: Negative.   Eyes: Negative.   Respiratory: Negative.   Cardiovascular: Negative.   Gastrointestinal: Positive for abdominal distention and abdominal pain. Negative for anal bleeding, blood in stool, constipation, diarrhea, nausea, rectal pain and vomiting.  Endocrine: Negative.   Genitourinary: Negative.   Musculoskeletal: Negative.   Skin: Negative.   Allergic/Immunologic: Negative.   Neurological: Negative.   Hematological: Negative.   Psychiatric/Behavioral: Negative.   All other systems reviewed and are negative.     Objective:    Physical Exam  Constitutional: He appears well-developed and well-nourished. No distress.  Cardiovascular: Normal rate and regular rhythm.  Pulmonary/Chest: Effort normal. No respiratory distress.  Abdominal: Soft. Normal appearance and bowel sounds are normal. He exhibits no distension and no mass. There is abdominal tenderness in the epigastric area. There is no rebound and no guarding. A hernia is present. Hernia confirmed positive in the ventral area.    Skin: Skin is warm and dry. No rash noted. He is not diaphoretic. No erythema. No pallor.  Psychiatric: He has a normal mood and affect. His behavior is normal. Judgment and thought content normal.  Nursing note and vitals reviewed.   BP 140/86   Pulse 90   Temp 97.8 F  (36.6 C) (Temporal)   Resp 17   Ht 6' 9.5" (2.07 m)   Wt (!) 379 lb 3.2 oz (172 kg)   SpO2 96%   BMI 40.14 kg/m  Wt Readings from Last 3 Encounters:  03/15/19 (!) 379 lb 3.2 oz (172 kg)  01/18/19 (!) 381 lb (172.8 kg)  08/27/18 (!) 375 lb (170.1 kg)     Health Maintenance Due  Topic Date Due  . HIV Screening  09/10/1987  . INFLUENZA VACCINE  10/06/2018    There are no preventive care reminders to display for this patient.  Lab Results  Component Value Date   TSH 1.030 07/30/2016   Lab Results  Component Value Date   WBC 3.7 07/30/2016   HGB 15.5 07/30/2016   HCT 44.7 07/30/2016   MCV 90 07/30/2016   PLT 237 07/30/2016   Lab Results  Component Value Date   NA 140 01/18/2019   K 4.9 01/18/2019   CO2 20 01/18/2019   GLUCOSE 101 (H) 01/18/2019   BUN 10 01/18/2019   CREATININE 1.32 (H) 01/18/2019   BILITOT 0.3 01/18/2019   ALKPHOS 85 01/18/2019   AST 21 01/18/2019   ALT 29 01/18/2019   PROT 7.2 01/18/2019   ALBUMIN 4.5 01/18/2019   CALCIUM 9.5 01/18/2019   Lab Results  Component Value Date   CHOL 228 (H) 01/18/2019   Lab Results  Component Value Date   HDL 39 (L) 01/18/2019   Lab Results  Component Value Date   LDLCALC 165 (H) 01/18/2019   Lab Results  Component Value Date   TRIG 134 01/18/2019   Lab Results  Component Value Date   CHOLHDL 5.8 (H) 01/18/2019   Lab Results  Component Value Date   HGBA1C 6.4 (H) 01/18/2019      Assessment & Plan:   Problem List Items Addressed This Visit    None    Visit Diagnoses    Ventral hernia without obstruction or gangrene    -  Primary   Need for prophylactic vaccination and inoculation against influenza          No orders of the defined types were placed in this encounter.   Follow-up: No follow-ups on file.   PLAN  Ventral hernia around 4-5cm in length, but difficult to determine based on exam  Discussed options with patient: conservative management vs. Surgical intervention. Pt  decides on conservative management, discussed red flag symptoms with patient who demonstrates understanding.  Discussed dieting for weight loss, including benefits of calorie counting, plant based diets, and making sustainable changes for weight loss. Pt demonstrates understanding and identifies some changes he can make. Will return to clinic with any further questions, comments, or concerns  Patient encouraged to call clinic with any questions, comments, or concerns.  Janeece Agee, NP

## 2019-04-04 NOTE — Telephone Encounter (Signed)
FMLA paperwork completed.

## 2019-06-03 ENCOUNTER — Emergency Department (HOSPITAL_BASED_OUTPATIENT_CLINIC_OR_DEPARTMENT_OTHER)
Admission: EM | Admit: 2019-06-03 | Discharge: 2019-06-03 | Disposition: A | Discharge status: Home / Self Care | Payer: BC Managed Care – PPO | Attending: Emergency Medicine | Admitting: Emergency Medicine

## 2019-06-03 ENCOUNTER — Encounter (HOSPITAL_BASED_OUTPATIENT_CLINIC_OR_DEPARTMENT_OTHER): Payer: Self-pay | Admitting: *Deleted

## 2019-06-03 ENCOUNTER — Other Ambulatory Visit: Payer: Self-pay

## 2019-06-03 ENCOUNTER — Emergency Department (HOSPITAL_BASED_OUTPATIENT_CLINIC_OR_DEPARTMENT_OTHER): Payer: BC Managed Care – PPO

## 2019-06-03 DIAGNOSIS — J45909 Unspecified asthma, uncomplicated: Secondary | ICD-10-CM | POA: Insufficient documentation

## 2019-06-03 DIAGNOSIS — Y999 Unspecified external cause status: Secondary | ICD-10-CM | POA: Diagnosis not present

## 2019-06-03 DIAGNOSIS — R519 Headache, unspecified: Secondary | ICD-10-CM | POA: Insufficient documentation

## 2019-06-03 DIAGNOSIS — Y9389 Activity, other specified: Secondary | ICD-10-CM | POA: Diagnosis not present

## 2019-06-03 DIAGNOSIS — R29810 Facial weakness: Secondary | ICD-10-CM | POA: Diagnosis not present

## 2019-06-03 DIAGNOSIS — H538 Other visual disturbances: Secondary | ICD-10-CM | POA: Insufficient documentation

## 2019-06-03 DIAGNOSIS — S0990XA Unspecified injury of head, initial encounter: Secondary | ICD-10-CM | POA: Diagnosis not present

## 2019-06-03 DIAGNOSIS — Y9289 Other specified places as the place of occurrence of the external cause: Secondary | ICD-10-CM | POA: Insufficient documentation

## 2019-06-03 DIAGNOSIS — W07XXXA Fall from chair, initial encounter: Secondary | ICD-10-CM | POA: Insufficient documentation

## 2019-06-03 DIAGNOSIS — R9431 Abnormal electrocardiogram [ECG] [EKG]: Secondary | ICD-10-CM | POA: Diagnosis not present

## 2019-06-03 HISTORY — DX: Unspecified injury of head, initial encounter: S09.90XA

## 2019-06-03 LAB — CBC
HCT: 47.1 % (ref 39.0–52.0)
Hemoglobin: 15.7 g/dL (ref 13.0–17.0)
MCH: 30.5 pg (ref 26.0–34.0)
MCHC: 33.3 g/dL (ref 30.0–36.0)
MCV: 91.6 fL (ref 80.0–100.0)
Platelets: 261 10*3/uL (ref 150–400)
RBC: 5.14 MIL/uL (ref 4.22–5.81)
RDW: 13.7 % (ref 11.5–15.5)
WBC: 6 10*3/uL (ref 4.0–10.5)
nRBC: 0 % (ref 0.0–0.2)

## 2019-06-03 LAB — COMPREHENSIVE METABOLIC PANEL
ALT: 28 U/L (ref 0–44)
AST: 18 U/L (ref 15–41)
Albumin: 4.4 g/dL (ref 3.5–5.0)
Alkaline Phosphatase: 66 U/L (ref 38–126)
Anion gap: 9 (ref 5–15)
BUN: 15 mg/dL (ref 6–20)
CO2: 22 mmol/L (ref 22–32)
Calcium: 9 mg/dL (ref 8.9–10.3)
Chloride: 106 mmol/L (ref 98–111)
Creatinine, Ser: 1.17 mg/dL (ref 0.61–1.24)
GFR calc Af Amer: >60 mL/min (ref >60)
GFR calc non Af Amer: >60 mL/min (ref >60)
Glucose, Bld: 93 mg/dL (ref 70–99)
Potassium: 4.1 mmol/L (ref 3.5–5.1)
Sodium: 137 mmol/L (ref 135–145)
Total Bilirubin: 0.8 mg/dL (ref 0.3–1.2)
Total Protein: 7.6 g/dL (ref 6.5–8.1)

## 2019-06-03 LAB — DIFFERENTIAL
Abs Immature Granulocytes: 0.02 10*3/uL (ref 0.00–0.07)
Basophils Absolute: 0 10*3/uL (ref 0.0–0.1)
Basophils Relative: 1 %
Eosinophils Absolute: 0.1 10*3/uL (ref 0.0–0.5)
Eosinophils Relative: 2 %
Immature Granulocytes: 0 %
Lymphocytes Relative: 49 %
Lymphs Abs: 2.9 10*3/uL (ref 0.7–4.0)
Monocytes Absolute: 0.8 10*3/uL (ref 0.1–1.0)
Monocytes Relative: 13 %
Neutro Abs: 2.1 10*3/uL (ref 1.7–7.7)
Neutrophils Relative %: 35 %

## 2019-06-03 LAB — PROTIME-INR
INR: 1.1 (ref 0.8–1.2)
Prothrombin Time: 13.6 seconds (ref 11.4–15.2)

## 2019-06-03 LAB — ETHANOL: Alcohol, Ethyl (B): <10 mg/dL (ref <10)

## 2019-06-03 LAB — APTT: aPTT: 29 seconds (ref 24–36)

## 2019-06-03 LAB — CBG MONITORING, ED: Glucose-Capillary: 83 mg/dL (ref 70–99)

## 2019-06-03 MED ORDER — SODIUM CHLORIDE 0.9 % IV BOLUS
1000.0000 mL | Freq: Once | INTRAVENOUS | Status: AC
  Administered 2019-06-03: 1000 mL via INTRAVENOUS

## 2019-06-03 MED ORDER — DIPHENHYDRAMINE HCL 50 MG/ML IJ SOLN
25.0000 mg | Freq: Once | INTRAMUSCULAR | Status: AC
  Administered 2019-06-03: 25 mg via INTRAVENOUS
  Filled 2019-06-03: qty 1

## 2019-06-03 MED ORDER — ACETAMINOPHEN 500 MG PO TABS
1000.0000 mg | ORAL_TABLET | Freq: Once | ORAL | Status: AC
  Administered 2019-06-03: 22:00:00 1000 mg via ORAL
  Filled 2019-06-03: qty 2

## 2019-06-03 MED ORDER — PROCHLORPERAZINE EDISYLATE 10 MG/2ML IJ SOLN
10.0000 mg | Freq: Once | INTRAMUSCULAR | Status: AC
  Administered 2019-06-03: 10 mg via INTRAVENOUS
  Filled 2019-06-03: qty 2

## 2019-06-03 NOTE — ED Notes (Signed)
Pt transported to CT ?

## 2019-06-03 NOTE — Consult Note (Signed)
[facility] Consult Services   Date of Service:   06/03/2019 21:10:41  Impression:     .  R20.2 - Paresthesia of skin     .  R53.1 - Weakness     .  R51 - HA (Headache)  Comments/Sign-Out: Patient presenting with fall from chair and head trauma. CT shows no bleeding. Totality of symptoms seems more migrainous/post-concussive. The left-sided weakness on exam does have a functional component, as left facial weakness is not present during conversation and easily distracted away, platysma function intact and cheek puff symmetric. LUE shows drift without pronation. Rule out right hemispheric infarct. Less likely cervical dissection, but can check vascular imaging. Presentation not consistent with LVO. Alteplase was offered to the patient, as an ischemic stroke could not be ruled out at this time, however, after a discussion of the risks/benefits/alternatives to alteplase the patient declined the medication.  Metrics: Last Known Well: 06/03/2019 18:50:00 [facility] Notification Time: 06/03/2019 21:10:41 Arrival Time: 06/03/2019 20:33:00 Stamp Time: 06/03/2019 21:10:41 Time First Login Attempt: 06/03/2019 21:16:00 Symptoms: HA, left-sided weakness/numbness NIHSS Start Assessment Time: 06/03/2019 21:19:00 Patient is not a candidate for Alteplase/Activase. Alteplase Medical Decision: 06/03/2019 21:28:00 Patient was not deemed candidate for Alteplase/Activase thrombolytics because the patient declined alteplase.  CT head showed no acute hemorrhage or acute core infarct.  Clinical Presentation is not Suggestive of Large Vessel Occlusive Disease  ED Physician notified of diagnostic impression and management plan on 06/03/2019 21:32:00    Our recommendations are outlined below.  Recommendations:     .  Antiplatelet Therapy Recommended     .  MRI brain wo     .  MRA head/neck     .  Non-narcotic migraine abortive therapy  Routine Consultation with  Inhouse Neurology for Follow up Care  Sign Out:     .  Discussed with Emergency Department Provider    ------------------------------------------------------------------------------  History of Present Illness: Patient is a 49 year old Male.  Patient was brought by private transportation with symptoms of HA, left-sided weakness/numbness  Patient with a history of OSA, IFG, chronic HA, depression. He reports being last normal at 1850. Thereafter he was sitting on a chair, which broke and he fell hitting the back of his head without any LOC. Since then he's had a headache, floaters in his vision, nausea, photophobia. He also reports mild left-sided weakness and numbness. No speech/language change, vision loss, double vision, neck pain, CP.      Examination: BP(125/87), Pulse(83), Blood Glucose(83) 1A: Level of Consciousness - Alert; keenly responsive + 0 1B: Ask Month and Age - Both Questions Right + 0 1C: Blink Eyes & Squeeze Hands - Performs Both Tasks + 0 2: Test Horizontal Extraocular Movements - Normal + 0 3: Test Visual Fields - No Visual Loss + 0 4: Test Facial Palsy (Use Grimace if Obtunded) - Minor paralysis (flat nasolabial fold, smile asymmetry) + 1 5A: Test Left Arm Motor Drift - Drift, but doesn't hit bed + 1 5B: Test Right Arm Motor Drift - No Drift for 10 Seconds + 0 6A: Test Left Leg Motor Drift - No Drift for 5 Seconds + 0 6B: Test Right Leg Motor Drift - No Drift for 5 Seconds + 0 7: Test Limb Ataxia (FNF/Heel-Shin) - No Ataxia + 0 8: Test Sensation - Mild-Moderate Loss: Less Sharp/More Dull + 1 9: Test Language/Aphasia - Normal; No aphasia + 0 10: Test Dysarthria - Normal + 0 11: Test Extinction/Inattention - No abnormality + 0 See  assessment for further detail. NIHSS Score: 3  Pre-Morbid Modified Ranking Scale: 0 Points = No symptoms at all   Patient/Family was informed the Neurology Consult would occur via TeleHealth consult by way of interactive audio and  video telecommunications and consented to receiving care in this manner.     Dr Arne Cleveland   [facility] [phone]  Case [id number]

## 2019-06-03 NOTE — Discharge Instructions (Addendum)
You were evaluated in the Emergency Department and after careful evaluation, we did not find any emergent condition requiring admission or further testing in the hospital.  Your exam/testing today is overall reassuring.  As discussed, we still recommend MRI of your brain but because your symptoms have resolved we feel it is safe to have this done as an outpatient.  Please return to the Emergency Department if you experience any worsening of your condition.  We encourage you to follow up with a primary care provider.  Thank you for allowing [place] to be a part of your care.

## 2019-06-03 NOTE — ED Triage Notes (Addendum)
Pt reports that he sat down in a chair tonight and the chair broke. Pt fell onto the floor, hit his posterior head on the floor. Denies blood thinner. Denies LOC. Reports HA with blurred vision and back pain at this time. Ambulatory. Pt reports nausea without vomiting at this time.  Pts left arm is weak, sensation decreased. Pt does have a slight droop to his left side of his face. LKN 6:50pm.

## 2019-06-03 NOTE — ED Provider Notes (Signed)
[facility]-EMERGENCY DEPT [facility] [place] Emergency Department Provider Note MRN:  [id number]  Arrival date & time: 06/03/19     Chief Complaint   Fall   History of Present Illness   Darryl Morton is a 49 y.o. year-old male with a history of chronic headaches presenting to the ED with chief complaint of fall.  Patient explains that he was at dinner and was sitting in a chair afterwards and the chair broke and he fell backwards striking the back of his head on the ground.  This occurred at 6 PM.  At about 6:50 PM, he began experiencing left-sided weakness and numbness to the face, arm, mildly in the leg.  Review of Systems  A complete 10 system review of systems was obtained and all systems are negative except as noted in the HPI and PMH.   Patient's Health History    Past Medical History:  Diagnosis Date  . Asthma   . Chronic headaches   . Chronic rhinitis 06/30/2011  . Depression   . GERD (gastroesophageal reflux disease) 06/30/2011  . OSA (obstructive sleep apnea) 06/30/2011   PSG 08/05/11>>AHI 32.8, SpO2 82%. CPAP 9 cm H2O>> 9 (mostly central hypopneas), +R, +S     Past Surgical History:  Procedure Laterality Date  . ACHILLES TENDON REPAIR     right  . APPENDECTOMY      Family History  Problem Relation Age of Onset  . Asthma Unknown        father side  . Allergies Unknown        father side    Social History   Socioeconomic History  . Marital status: Married    Spouse name: Not on file  . Number of children: Not on file  . Years of education: Not on file  . Highest education level: Not on file  Occupational History  . Occupation: Producer, television/film/video  Tobacco Use  . Smoking status: Never Smoker  . Smokeless tobacco: Never Used  . Tobacco comment: exposed to second hand smoke  Substance and Sexual Activity  . Alcohol use: No    Alcohol/week: 0.0 standard drinks  . Drug use: No  . Sexual activity: Yes  Other Topics Concern  . Not on file  Social  History Narrative  . Not on file   Social Determinants of Health   Financial Resource Strain:   . Difficulty of Paying Living Expenses:   Food Insecurity:   . Worried About Programme researcher, broadcasting/film/video in the Last Year:   . Barista in the Last Year:   Transportation Needs:   . Freight forwarder (Medical):   Marland Kitchen Lack of Transportation (Non-Medical):   Physical Activity:   . Days of Exercise per Week:   . Minutes of Exercise per Session:   Stress:   . Feeling of Stress :   Social Connections:   . Frequency of Communication with Friends and Family:   . Frequency of Social Gatherings with Friends and Family:   . Attends Religious Services:   . Active Member of Clubs or Organizations:   . Attends Banker Meetings:   Marland Kitchen Marital Status:   Intimate Partner Violence:   . Fear of Current or Ex-Partner:   . Emotionally Abused:   Marland Kitchen Physically Abused:   . Sexually Abused:      Physical Exam   Vitals:   06/03/19 2215 06/03/19 2314  BP: 132/86 132/71  Pulse: 82 72  Resp: 18 14  Temp:  97.7 F (36.5 C)  SpO2: 97% 99%    CONSTITUTIONAL: Well-appearing, NAD NEURO:  Alert and oriented x 3, left facial droop, left pronator drift, decreased sensation to the left arm and 1 EYES:  eyes equal and reactive ENT/NECK:  no LAD, no JVD CARDIO: Regular rate, well-perfused, normal S1 and S2 PULM:  CTAB no wheezing or rhonchi GI/GU:  normal bowel sounds, non-distended, non-tender MSK/SPINE:  No gross deformities, no edema SKIN:  no rash, atraumatic PSYCH:  Appropriate speech and behavior  *Additional and/or pertinent findings included in MDM below  Diagnostic and Interventional Summary    EKG Interpretation  Date/Time:  Monday June 03 2019 21:20:40 EDT Ventricular Rate:  77 PR Interval:    QRS Duration: 88 QT Interval:  378 QTC Calculation: 428 R Axis:   157 Text Interpretation: Sinus rhythm Consider left atrial enlargement Low voltage, precordial leads Probable  right ventricular hypertrophy Minimal ST elevation, inferior leads Confirmed by Gerlene Fee [phone]) on 06/03/2019 9:28:43 PM      Labs Reviewed  ETHANOL  CBC  DIFFERENTIAL  COMPREHENSIVE METABOLIC PANEL  PROTIME-INR  APTT  RAPID URINE DRUG SCREEN, HOSP PERFORMED  URINALYSIS, ROUTINE W REFLEX MICROSCOPIC  CBG MONITORING, ED    CT HEAD CODE STROKE WO CONTRAST  Final Result    CT CERVICAL SPINE WO CONTRAST  Final Result      Medications  diphenhydrAMINE (BENADRYL) injection 25 mg (25 mg Intravenous Given 06/03/19 2146)  prochlorperazine (COMPAZINE) injection 10 mg (10 mg Intravenous Given 06/03/19 2146)  sodium chloride 0.9 % bolus 1,000 mL (0 mLs Intravenous Stopped 06/03/19 2310)  acetaminophen (TYLENOL) tablet 1,000 mg (1,000 mg Oral Given 06/03/19 2146)     Procedures  /  Critical Care Procedures  ED Course and Medical Decision Making  I have reviewed the triage vital signs, the nursing notes, and pertinent available records from the EMR.  Pertinent labs & imaging results that were available during my care of the patient were reviewed by me and considered in my medical decision making (see below for details).     Patient arrives with a last known normal of 6:50 PM with left-sided deficits, occurring about 1 hour after head trauma.  Question of subarachnoid or traumatic injury to the cervical spine, though that would not explain the facial droop.  Code stroke was initiated, noncontrast imaging was normal and reassuring.  There is no outward signs of trauma to the head.  Evaluated by teleneurology, who feels that the neurological deficits are inconsistent, possibly functional versus complex migraine.  They do not recommend TPA, recommend, migraine cocktail and reassessment, possibly MRI.  Specifically asked about need for CTA imaging, and they felt this was unnecessary given that there is no concern for large vessel occlusion stroke based on exam at this time.  Patient is completely  recovered after migraine cocktail, no numbness or weakness, no headache.  I discussed the recommendation of MRI to exclude stroke or other emergent process, he does not wish to be transferred, requests to have the MRI performed as an outpatient, wants to go home.  Strict return precautions, referred to neurology.    Barth Kirks. Sedonia Small, MD [place] [email or web address]  Final Clinical Impressions(s) / ED Diagnoses     ICD-10-CM   1. Injury of head, initial encounter  S09.90XA     ED Discharge Orders         Ordered    Ambulatory referral to Neurology    Comments:  An appointment is requested in approximately: 2 weeks   06/03/19 2310           Discharge Instructions Discussed with and Provided to Patient:     Discharge Instructions     You were evaluated in the Emergency Department and after careful evaluation, we did not find any emergent condition requiring admission or further testing in the hospital.  Your exam/testing today is overall reassuring.  As discussed, we still recommend MRI of your brain but because your symptoms have resolved we feel it is safe to have this done as an outpatient.  Please return to the Emergency Department if you experience any worsening of your condition.  We encourage you to follow up with a primary care provider.  Thank you for allowing [place] to be a part of your care.       Sabas Sous, MD 06/03/19 [phone]

## 2019-06-07 ENCOUNTER — Ambulatory Visit: Payer: BC Managed Care – PPO | Admitting: Diagnostic Neuroimaging

## 2019-06-07 ENCOUNTER — Other Ambulatory Visit: Payer: Self-pay

## 2019-06-07 ENCOUNTER — Encounter: Payer: Self-pay | Admitting: Diagnostic Neuroimaging

## 2019-06-07 VITALS — BP 134/88 | HR 93 | Temp 97.8°F | Ht >= 80 in | Wt 370.6 lb

## 2019-06-07 DIAGNOSIS — G44319 Acute post-traumatic headache, not intractable: Secondary | ICD-10-CM

## 2019-06-07 DIAGNOSIS — G43109 Migraine with aura, not intractable, without status migrainosus: Secondary | ICD-10-CM

## 2019-06-07 DIAGNOSIS — R531 Weakness: Secondary | ICD-10-CM

## 2019-06-07 NOTE — Patient Instructions (Addendum)
  POST-TRAUMATIC HEADACHE / LEFT SIDED WEAKNESS (? Complicated migraine; TIA possible but less likely - MRI brain (rule out stroke); check carotid and heart ultrasound - continue aspirin 81mg  daily, statin, fish oil - follow up with PCP re: lipids, A1c and BP control - follow up sleep study

## 2019-06-07 NOTE — Progress Notes (Signed)
[facility]  PATIENT: Darryl Morton DOB: 1970-08-28  REFERRING CLINICIAN: Sabas Sous, MD HISTORY FROM: patient  REASON FOR VISIT: new consult    HISTORICAL  CHIEF COMPLAINT:  Chief Complaint  Patient presents with  . Head Injury    rm 7 New Pt, wife- Chanel,  ED referral    HISTORY OF PRESENT ILLNESS:   49 year old male here for evaluation of posttraumatic headache and left-sided weakness.  06/03/2019 patient went to dinner with friends and family.  When he sat down in his chair within a few seconds the wooden chair broke and he fell down.  He fell backwards and hit his head.  He initially he was able to get up and did not have any problems.  Then he started to see spots, left arm and left face are weak.  He started to have a headache.  He was having some throbbing pain, sensitivity light, nausea.  Patient went to the hospital for evaluation.  Code stroke was initiated and teleneurology consult was obtained.  Fluctuating symptoms were noted and consideration of stroke versus complicated migraine was entertained.  IV TPA was considered but decided not to infuse.  Symptoms improved over the next 12 hours.  Since that time left-sided weakness has resolved.  He does have ongoing headaches with throbbing sensation, nausea and sensitivity to light.  No prior history of migraine headaches.  No family history of migraines.    REVIEW OF SYSTEMS: Full 14 system review of systems performed and negative with exception of: As per HPI.  ALLERGIES: No Known Allergies  HOME MEDICATIONS: Outpatient Medications Prior to Visit  Medication Sig Dispense Refill  . montelukast (SINGULAIR) 10 MG tablet TAKE 1 TABLET BY MOUTH EVERYDAY AT BEDTIME.MAX ON INSURANCE 90 tablet 3  . omeprazole (PRILOSEC) 20 MG capsule Take 1 capsule (20 mg total) by mouth daily. 90 capsule 3  . pravastatin (PRAVACHOL) 20 MG tablet TAKE 1 TABLET BY MOUTH EVERY DAY.MAX ON INSURANCE 90 tablet 3  .  PROAIR HFA 108 (90 Base) MCG/ACT inhaler TAKE 2 PUFFS BY MOUTH EVERY 6 HOURS AS NEEDED FOR WHEEZE OR SHORTNESS OF BREATH 8.5 Inhaler 2  . terbinafine (LAMISIL) 250 MG tablet Take 1 tablet (250 mg total) by mouth daily. 90 tablet 1  . venlafaxine XR (EFFEXOR-XR) 150 MG 24 hr capsule TAKE 1 CAPSULE (150 MG TOTAL) BY MOUTH DAILY WITH BREAKFAST.MAX ON INSURANCE 90 capsule 3  . benzonatate (TESSALON) 200 MG capsule Take 1 capsule (200 mg total) by mouth 2 (two) times daily as needed for cough. (Patient not taking: Reported on 06/07/2019) 20 capsule 3  . fluticasone (FLOVENT HFA) 110 MCG/ACT inhaler Inhale 1 puff into the lungs 2 (two) times daily. (Patient not taking: Reported on 03/15/2019) 1 Inhaler 5  . predniSONE (DELTASONE) 20 MG tablet Take 2 tablets (40 mg total) by mouth daily with breakfast. 10 tablet 0   No facility-administered medications prior to visit.    PAST MEDICAL HISTORY: Past Medical History:  Diagnosis Date  . Asthma   . Chronic headaches   . Chronic rhinitis 06/30/2011  . Depression   . GERD (gastroesophageal reflux disease) 06/30/2011  . Head injury 06/03/2019  . OSA (obstructive sleep apnea) 06/30/2011   PSG 08/05/11>>AHI 32.8, SpO2 82%. CPAP 9 cm H2O>> 9 (mostly central hypopneas), +R, +S     PAST SURGICAL HISTORY: Past Surgical History:  Procedure Laterality Date  . ACHILLES TENDON REPAIR     right  . APPENDECTOMY  FAMILY HISTORY: Family History  Problem Relation Age of Onset  . Asthma Other        father side  . Allergies Other        father side    SOCIAL HISTORY: Social History   Socioeconomic History  . Marital status: Married    Spouse name: Chantel  . Number of children: 4  . Years of education: Not on file  . Highest education level: Master's degree (e.g., MA, MS, MEng, MEd, MSW, MBA)  Occupational History  . Occupation: Therapist, art  Tobacco Use  . Smoking status: Never Smoker  . Smokeless tobacco: Never Used  . Tobacco  comment: exposed to second hand smoke  Substance and Sexual Activity  . Alcohol use: No    Alcohol/week: 0.0 standard drinks  . Drug use: No  . Sexual activity: Yes  Other Topics Concern  . Not on file  Social History Narrative   Lives with wife   Social Determinants of Health   Financial Resource Strain:   . Difficulty of Paying Living Expenses:   Food Insecurity:   . Worried About Charity fundraiser in the Last Year:   . Arboriculturist in the Last Year:   Transportation Needs:   . Film/video editor (Medical):   Marland Kitchen Lack of Transportation (Non-Medical):   Physical Activity:   . Days of Exercise per Week:   . Minutes of Exercise per Session:   Stress:   . Feeling of Stress :   Social Connections:   . Frequency of Communication with Friends and Family:   . Frequency of Social Gatherings with Friends and Family:   . Attends Religious Services:   . Active Member of Clubs or Organizations:   . Attends Archivist Meetings:   Marland Kitchen Marital Status:   Intimate Partner Violence:   . Fear of Current or Ex-Partner:   . Emotionally Abused:   Marland Kitchen Physically Abused:   . Sexually Abused:      PHYSICAL EXAM  GENERAL EXAM/CONSTITUTIONAL: Vitals:  Vitals:   06/07/19 1123  BP: 134/88  Pulse: 93  Temp: 97.8 F (36.6 C)  Weight: (!) 370 lb 9.6 oz (168.1 kg)  Height: 6' 9.5" (2.07 m)     Body mass index is 39.23 kg/m. Wt Readings from Last 3 Encounters:  06/07/19 (!) 370 lb 9.6 oz (168.1 kg)  06/03/19 (!) 360 lb (163.3 kg)  03/15/19 (!) 379 lb 3.2 oz (172 kg)     Patient is in no distress; well developed, nourished and groomed; neck is supple  CARDIOVASCULAR:  Examination of carotid arteries is normal; no carotid bruits  Regular rate and rhythm, no murmurs  Examination of peripheral vascular system by observation and palpation is normal  EYES:  Ophthalmoscopic exam of optic discs and posterior segments is normal; no papilledema or hemorrhages  No exam  data present  MUSCULOSKELETAL:  Gait, strength, tone, movements noted in Neurologic exam below  NEUROLOGIC: MENTAL STATUS:  No flowsheet data found.  awake, alert, oriented to person, place and time  recent and remote memory intact  normal attention and concentration  language fluent, comprehension intact, naming intact  fund of knowledge appropriate  CRANIAL NERVE:   2nd - no papilledema on fundoscopic exam  2nd, 3rd, 4th, 6th - pupils equal and reactive to light, visual fields full to confrontation, extraocular muscles intact, no nystagmus; EXOTROPIA  5th - facial sensation symmetric  7th - facial strength symmetric  8th -  hearing intact  9th - palate elevates symmetrically, uvula midline  11th - shoulder shrug symmetric  12th - tongue protrusion midline  MOTOR:   normal bulk and tone, full strength in the BUE, BLE  SENSORY:   normal and symmetric to light touch, temperature, vibration  COORDINATION:   finger-nose-finger, fine finger movements normal  REFLEXES:   deep tendon reflexes present and symmetric  GAIT/STATION:   narrow based gait     DIAGNOSTIC DATA (LABS, IMAGING, TESTING) - I reviewed patient records, labs, notes, testing and imaging myself where available.  Lab Results  Component Value Date   WBC 6.0 06/03/2019   HGB 15.7 06/03/2019   HCT 47.1 06/03/2019   MCV 91.6 06/03/2019   PLT 261 06/03/2019      Component Value Date/Time   NA 137 06/03/2019 2109   NA 140 01/18/2019 1522   K 4.1 06/03/2019 2109   CL 106 06/03/2019 2109   CO2 22 06/03/2019 2109   GLUCOSE 93 06/03/2019 2109   BUN 15 06/03/2019 2109   BUN 10 01/18/2019 1522   CREATININE 1.17 06/03/2019 2109   CREATININE 1.37 (H) 12/22/2015 0933   CALCIUM 9.0 06/03/2019 2109   PROT 7.6 06/03/2019 2109   PROT 7.2 01/18/2019 1522   ALBUMIN 4.4 06/03/2019 2109   ALBUMIN 4.5 01/18/2019 1522   AST 18 06/03/2019 2109   ALT 28 06/03/2019 2109   ALKPHOS 66 06/03/2019  2109   BILITOT 0.8 06/03/2019 2109   BILITOT 0.3 01/18/2019 1522   GFRNONAA >60 06/03/2019 2109   GFRAA >60 06/03/2019 2109   Lab Results  Component Value Date   CHOL 228 (H) 01/18/2019   HDL 39 (L) 01/18/2019   LDLCALC 165 (H) 01/18/2019   TRIG 134 01/18/2019   CHOLHDL 5.8 (H) 01/18/2019   Lab Results  Component Value Date   HGBA1C 6.4 (H) 01/18/2019   No results found for: VITAMINB12 Lab Results  Component Value Date   TSH 1.030 07/30/2016    08/05/11 PSG - IMPRESSIONS-RECOMMENDATIONS:  This study shows evidence for severe obstructive sleep apnea with an apnea-hypopnea index of 32.8 and oxygen saturation rate of 82%.  06/03/19 CT head [I reviewed images myself and agree with interpretation. -VRP]  1. Normal CT appearance of the brain. 2. No acute traumatic abnormality 3. ASPECTS is 10/10   ASSESSMENT AND PLAN  49 y.o. year old male here with:  Dx:  1. Acute post-traumatic headache, not intractable   2. Migraine with aura and without status migrainosus, not intractable   3. Complicated migraine      PLAN:  POST-TRAUMATIC HEADACHE / LEFT SIDED WEAKNESS (? Complicated migraine; TIA possible but less likely - MRI brain (rule out stroke); check carotid u/s and TTE - continue aspirin 81mg  daily, statin, fish oil - follow up with PCP re: lipids, A1c and BP control - may consider migraine prevention in the future if headaches persist or increase  SEVERE OSA (2013 study) - follow up sleep study / OSA (Dr. 10-05-1970)  Orders Placed This Encounter  Procedures  . MR BRAIN W WO CONTRAST  . ECHOCARDIOGRAM COMPLETE BUBBLE STUDY  . VAS Craige Cotta CAROTID   Return for pending if symptoms worsen or fail to improve.  I reviewed images, labs, notes, records myself. I summarized findings and reviewed with patient, for this high risk condition (complicated migraine versus TIA) requiring high complexity decision making.    [place], MD 06/07/2019, 11:48 AM Certified in  Neurology, Neurophysiology and Neuroimaging  [facility] [address] [place],  [address] [phone]

## 2019-06-12 ENCOUNTER — Telehealth: Payer: Self-pay | Admitting: Diagnostic Neuroimaging

## 2019-06-12 NOTE — Telephone Encounter (Signed)
[employer] Auth: [id number] (exp. 06/12/19 to 12/08/19) order faxed to [facility]. they will reach out to the patient to schedule

## 2019-06-13 NOTE — Telephone Encounter (Signed)
Patient is scheduled at [facility] for 06/24/19.

## 2019-06-24 ENCOUNTER — Ambulatory Visit (HOSPITAL_COMMUNITY)
Admission: RE | Admit: 2019-06-24 | Discharge: 2019-06-24 | Disposition: A | Discharge status: Home / Self Care | Payer: BC Managed Care – PPO | Source: Ambulatory Visit | Attending: Diagnostic Neuroimaging | Admitting: Diagnostic Neuroimaging

## 2019-06-24 ENCOUNTER — Other Ambulatory Visit: Payer: Self-pay

## 2019-06-24 DIAGNOSIS — R519 Headache, unspecified: Secondary | ICD-10-CM | POA: Diagnosis not present

## 2019-06-24 DIAGNOSIS — R531 Weakness: Secondary | ICD-10-CM | POA: Diagnosis not present

## 2019-06-24 DIAGNOSIS — G43109 Migraine with aura, not intractable, without status migrainosus: Secondary | ICD-10-CM | POA: Insufficient documentation

## 2019-06-24 DIAGNOSIS — G44319 Acute post-traumatic headache, not intractable: Secondary | ICD-10-CM

## 2019-06-26 ENCOUNTER — Telehealth: Payer: Self-pay | Admitting: *Deleted

## 2019-06-26 NOTE — Telephone Encounter (Signed)
Received MRI brain results from [facility]. Placed on Dr [employer] desk for review.

## 2019-07-01 ENCOUNTER — Encounter: Payer: Self-pay | Admitting: *Deleted

## 2019-07-01 MED ORDER — TOPIRAMATE 50 MG PO TABS: 50.0000 mg | ORAL_TABLET | Freq: Two times a day (BID) | ORAL | 12 refills | Status: DC

## 2019-07-01 NOTE — Telephone Encounter (Signed)
-   start TPX 50mg  in PM; after 1-2 weeks increase to 50mg  twice a day; drink plenty of water.  Meds ordered this encounter  Medications  . topiramate (TOPAMAX) 50 MG tablet    Sig: Take 1 tablet (50 mg total) by mouth 2 (two) times daily.    Dispense:  60 tablet    Refill:  12    , MD 07/01/2019, 2:48 PM Certified in Neurology, Neurophysiology and Neuroimaging  [facility] [address] [place], [address] [place] [phone]

## 2019-07-01 NOTE — Telephone Encounter (Signed)
LVM informing patient of new Rx and MD instructions. I advised him I will send a detailed my chart as well. Left #.

## 2019-07-01 NOTE — Addendum Note (Signed)
Addended by: Joycelyn Schmid R on: 07/01/2019 02:48 PM   Modules accepted: Orders

## 2019-07-01 NOTE — Telephone Encounter (Addendum)
Spoke with patient and informed him the MRI brain scan is normal. He had Carotid US last week, results not available at this time. He stated he continues to have  light headaches when he wakes or in front of a screen for a long time, has full blown headache 3 x week. He takes Excedrin migraine once a day or cup of coffee with ibuprofen and  Occasionally has nausea with headache. He also reported short term memory getting worse. He would like to know what else he can do for headaches. I advised will discuss with MD, let him know. Patient verbalized understanding, appreciation.

## 2019-08-02 ENCOUNTER — Ambulatory Visit (HOSPITAL_COMMUNITY): Payer: BC Managed Care – PPO | Attending: Diagnostic Neuroimaging

## 2019-08-02 ENCOUNTER — Other Ambulatory Visit: Payer: Self-pay

## 2019-08-02 DIAGNOSIS — G43109 Migraine with aura, not intractable, without status migrainosus: Secondary | ICD-10-CM | POA: Insufficient documentation

## 2019-08-02 DIAGNOSIS — G44319 Acute post-traumatic headache, not intractable: Secondary | ICD-10-CM | POA: Diagnosis not present

## 2019-08-02 DIAGNOSIS — R531 Weakness: Secondary | ICD-10-CM | POA: Insufficient documentation

## 2019-08-02 MED ORDER — SODIUM CHLORIDE 0.9% FLUSH
10.0000 mL | INTRAVENOUS | Status: AC | PRN
  Administered 2019-08-02: 30 mL via INTRAVENOUS

## 2019-08-12 ENCOUNTER — Encounter: Payer: Self-pay | Admitting: *Deleted

## 2019-08-12 ENCOUNTER — Telehealth: Payer: Self-pay | Admitting: *Deleted

## 2019-08-12 NOTE — Telephone Encounter (Signed)
Increase topiramate to 100mg twice a day. -VRP 

## 2019-08-12 NOTE — Telephone Encounter (Signed)
Received my chart :  I am still having these migraines, but they are becoming less frequent. About twice a week or when I am working in front of my computer to long. Is there anything i can do to get rid of these or am i at the point of just managing the pain. I requested more information. His reply below:  I am taking the Topamax 50mg  twice a day. I am having them a migraine on average at least twice sometimes 3 times a week. Prior to taking the topamax i had been having more frequent migraines as almost every day. The Excedrin Migraine nor Ibuprofen does give some relief only when i take more than the recommended dosage. If this will be a regular thing, i just want to get in controlled so i can continue to work.  Routed to MD for recommendations. 

## 2019-08-20 DIAGNOSIS — G44319 Acute post-traumatic headache, not intractable: Secondary | ICD-10-CM | POA: Diagnosis not present

## 2019-08-20 DIAGNOSIS — J4540 Moderate persistent asthma, uncomplicated: Secondary | ICD-10-CM | POA: Diagnosis not present

## 2019-08-23 DIAGNOSIS — E6609 Other obesity due to excess calories: Secondary | ICD-10-CM | POA: Diagnosis not present

## 2019-08-23 DIAGNOSIS — J4541 Moderate persistent asthma with (acute) exacerbation: Secondary | ICD-10-CM | POA: Diagnosis not present

## 2019-08-23 DIAGNOSIS — G4733 Obstructive sleep apnea (adult) (pediatric): Secondary | ICD-10-CM | POA: Diagnosis not present

## 2019-09-10 ENCOUNTER — Telehealth: Payer: Self-pay | Admitting: *Deleted

## 2019-09-10 NOTE — Telephone Encounter (Addendum)
Patient called and asked for details of VM. Answered his questions to his stated understanding.  He then stated he has noted migraines triggered by exertion, dehydration, not eating regularly. He asked for advice; I advised he avoid triggers if possible, continue with his weight loss, exercise he stated he began, controlling BP. Advised if frequency, severity worsen he can consider migraine preventative medicines. Patient verbalized understanding, appreciation.

## 2019-09-10 NOTE — Telephone Encounter (Signed)
LVM informing patient the carotid US showed incidental left ICA stenosis (asymptomatic; 40-59%). Continue medical manageenmt. Advised he repeat in 1 year with PCP. Left # for questions.

## 2019-09-16 ENCOUNTER — Other Ambulatory Visit: Payer: Self-pay

## 2019-09-16 DIAGNOSIS — F331 Major depressive disorder, recurrent, moderate: Secondary | ICD-10-CM

## 2019-09-16 MED ORDER — VENLAFAXINE HCL ER 150 MG PO CP24: ORAL_CAPSULE | ORAL | 0 refills | Status: AC

## 2019-09-30 DIAGNOSIS — R6882 Decreased libido: Secondary | ICD-10-CM | POA: Diagnosis not present

## 2019-10-03 ENCOUNTER — Encounter: Payer: Self-pay | Admitting: *Deleted

## 2019-10-03 ENCOUNTER — Telehealth: Payer: Self-pay | Admitting: *Deleted

## 2019-10-03 MED ORDER — NURTEC 75 MG PO TBDP: 75.0000 mg | ORAL_TABLET | Freq: Every day | ORAL | 6 refills | Status: DC | PRN

## 2019-10-03 NOTE — Telephone Encounter (Signed)
Called patient to advise of Dr Fonda Kinder reply to my chart. LVM with details and replied to his my chart with MD message.

## 2019-10-03 NOTE — Addendum Note (Signed)
Addended by: Joycelyn Schmid R on: 10/03/2019 06:03 PM   Modules accepted: Orders

## 2019-10-03 NOTE — Telephone Encounter (Signed)
Meds ordered this encounter  Medications  . Rimegepant Sulfate (NURTEC) 75 MG TBDP    Sig: Take 75 mg by mouth daily as needed.    Dispense:  8 tablet    Refill:  6    Suanne Marker, MD 10/03/2019, 6:03 PM Certified in Neurology, Neurophysiology and Neuroimaging  [facility] [address], [address] [place], [place] [address] [phone]

## 2019-10-03 NOTE — Telephone Encounter (Signed)
Pt is asking for a call back from Mary C, RN 

## 2019-10-03 NOTE — Telephone Encounter (Addendum)
Called patient and reviewed Dr Richrd Humbles reply. He was supposed to be on TPX 100mg  twice a day. Can offer migraine infusion, ajovy, nurtex. If sudden vision loss, then ER eval. If transient vision changes with migraine then monitor. Also needs to se ophthalmology re: vision issues. He stated he has contacted eye dr, doesn't want an injection but asked about another "pill" to prevent headaches. He would like to try Nurtec, explained that it'll require PA, and if denied he can use discount card. He stated he'll continue with  topamax and try  Nurtec as needed for his headaches. Advised will let Dr know. Patient verbalized understanding, appreciation.

## 2019-10-14 ENCOUNTER — Telehealth: Payer: Self-pay | Admitting: *Deleted

## 2019-10-14 NOTE — Telephone Encounter (Signed)
Nurtec PA, key: [id number], [id number]. Your information has been submitted to [facility]. [employer] [place] will review the request and notify you of the determination decision directly, typically within 72 hours of receiving all information. If [employer] [place] has not responded within the specified timeframe or if you have any questions about your PA submission, contact [employer] [place] directly at [phone].

## 2019-10-15 NOTE — Telephone Encounter (Signed)
Per CMM, Nurtec denied by [facility]. Have not received denial letter with reasons.

## 2019-10-16 ENCOUNTER — Encounter: Payer: Self-pay | Admitting: *Deleted

## 2019-10-21 NOTE — Telephone Encounter (Signed)
I called the patient and left a message.  Indicates in his note that higher doses of Excedrin Migraine or ibuprofen may sometimes help the headache, we may try Toradol if he has not tried this previously.  I think they are trying to avoid triptan medication because of hemiplegic features associated with his migraine.  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  The patient will call back if he wants me to send in a prescription for Toradol.

## 2019-10-22 NOTE — Telephone Encounter (Signed)
[employer], spoke with Owens Loffler, requested denial letter of Nurtec as I never received it. She will fax over. Received fax of denial of Nurtec: he must try two different Triptan medications and they did not work or were not tolerated. Have written appeal letter, on MD's desk for review, signature.

## 2019-10-28 DIAGNOSIS — Z0289 Encounter for other administrative examinations: Secondary | ICD-10-CM

## 2019-10-30 DIAGNOSIS — R7989 Other specified abnormal findings of blood chemistry: Secondary | ICD-10-CM | POA: Diagnosis not present

## 2019-10-30 DIAGNOSIS — J4541 Moderate persistent asthma with (acute) exacerbation: Secondary | ICD-10-CM | POA: Diagnosis not present

## 2019-10-31 ENCOUNTER — Telehealth: Payer: Self-pay | Admitting: Diagnostic Neuroimaging

## 2019-10-31 NOTE — Telephone Encounter (Signed)
LVM regarding payment for FMLA form. gb 

## 2019-11-04 ENCOUNTER — Encounter: Payer: Self-pay | Admitting: *Deleted

## 2019-11-04 NOTE — Telephone Encounter (Addendum)
Received [employer] FMLA papers for patient. Called patient and left detailed VM with questions re: FMLA questions. Advised he may reply via my chart as well.

## 2019-11-04 NOTE — Telephone Encounter (Signed)
Received my chart reply, [employer] FMLA papers on MD desk for review, signature.

## 2019-11-05 ENCOUNTER — Encounter: Payer: Self-pay | Admitting: *Deleted

## 2019-11-05 NOTE — Telephone Encounter (Signed)
[employer] FMLA papers completed, signed and sen to medical records for processing. Sent him my chart to advise.

## 2019-11-06 ENCOUNTER — Telehealth: Payer: Self-pay | Admitting: *Deleted

## 2019-11-06 NOTE — Telephone Encounter (Signed)
[place] form faxed.

## 2019-11-20 DIAGNOSIS — R7989 Other specified abnormal findings of blood chemistry: Secondary | ICD-10-CM | POA: Diagnosis not present

## 2019-11-23 DIAGNOSIS — R7889 Finding of other specified substances, not normally found in blood: Secondary | ICD-10-CM | POA: Diagnosis not present

## 2019-11-27 ENCOUNTER — Ambulatory Visit: Payer: Self-pay | Admitting: Diagnostic Neuroimaging

## 2019-12-11 DIAGNOSIS — H50331 Intermittent monocular exotropia, right eye: Secondary | ICD-10-CM | POA: Diagnosis not present

## 2019-12-11 DIAGNOSIS — H527 Unspecified disorder of refraction: Secondary | ICD-10-CM | POA: Diagnosis not present

## 2019-12-11 DIAGNOSIS — G43109 Migraine with aura, not intractable, without status migrainosus: Secondary | ICD-10-CM | POA: Diagnosis not present

## 2019-12-14 DIAGNOSIS — R7989 Other specified abnormal findings of blood chemistry: Secondary | ICD-10-CM | POA: Diagnosis not present

## 2019-12-23 ENCOUNTER — Ambulatory Visit: Payer: Self-pay | Admitting: Diagnostic Neuroimaging

## 2020-01-06 DIAGNOSIS — B351 Tinea unguium: Secondary | ICD-10-CM | POA: Diagnosis not present

## 2020-01-06 DIAGNOSIS — L603 Nail dystrophy: Secondary | ICD-10-CM | POA: Diagnosis not present

## 2020-01-06 DIAGNOSIS — L853 Xerosis cutis: Secondary | ICD-10-CM | POA: Diagnosis not present

## 2020-01-13 ENCOUNTER — Telehealth: Payer: Self-pay | Admitting: *Deleted

## 2020-01-13 ENCOUNTER — Encounter: Payer: Self-pay | Admitting: Diagnostic Neuroimaging

## 2020-01-13 ENCOUNTER — Telehealth (INDEPENDENT_AMBULATORY_CARE_PROVIDER_SITE_OTHER): Payer: BC Managed Care – PPO | Admitting: Diagnostic Neuroimaging

## 2020-01-13 DIAGNOSIS — L603 Nail dystrophy: Secondary | ICD-10-CM | POA: Diagnosis not present

## 2020-01-13 DIAGNOSIS — G43109 Migraine with aura, not intractable, without status migrainosus: Secondary | ICD-10-CM

## 2020-01-13 MED ORDER — TOPIRAMATE 50 MG PO TABS: 50.0000 mg | ORAL_TABLET | Freq: Two times a day (BID) | ORAL | 12 refills | Status: AC

## 2020-01-13 NOTE — Telephone Encounter (Signed)
LVM advising patient I was sending in letter and appeal for Nurtec. If this doesn't work he will have to sign paper giving [place] permission to appeal on his behalf. Letter and today's office note faxed to [facility] [employer].F [phone].

## 2020-01-13 NOTE — Progress Notes (Signed)
[facility]  PATIENT: Darryl Morton DOB: 1970/06/27  REFERRING CLINICIAN: Doristine Bosworth, MD HISTORY FROM: patient  REASON FOR VISIT: follow up   HISTORICAL  CHIEF COMPLAINT:  Chief Complaint  Patient presents with  . Migraine    HISTORY OF PRESENT ILLNESS:   UPDATE (01/13/20, VRP): Since last visit, doing well. Symptoms are stable. Avg 2 headaches / month. Prolonged tiime on computer screens can aggravate headaches. Tolerating topiramate.    PRIOR HPI (06/07/19): 49 year old male here for evaluation of posttraumatic headache and left-sided weakness.  06/03/2019 patient went to dinner with friends and family.  When he sat down in his chair within a few seconds the wooden chair broke and he fell down.  He fell backwards and hit his head.  He initially he was able to get up and did not have any problems.  Then he started to see spots, left arm and left face are weak.  He started to have a headache.  He was having some throbbing pain, sensitivity light, nausea.  Patient went to the hospital for evaluation.  Code stroke was initiated and teleneurology consult was obtained.  Fluctuating symptoms were noted and consideration of stroke versus complicated migraine was entertained.  IV TPA was considered but decided not to infuse.  Symptoms improved over the next 12 hours.  Since that time left-sided weakness has resolved.  He does have ongoing headaches with throbbing sensation, nausea and sensitivity to light.  No prior history of migraine headaches.  No family history of migraines.   REVIEW OF SYSTEMS: Full 14 system review of systems performed and negative with exception of: As per HPI.  ALLERGIES: No Known Allergies  HOME MEDICATIONS: Outpatient Medications Prior to Visit  Medication Sig Dispense Refill  . benzonatate (TESSALON) 200 MG capsule Take 1 capsule (200 mg total) by mouth 2 (two) times daily as needed for cough. (Patient not taking: Reported on 06/07/2019)  20 capsule 3  . fluticasone (FLOVENT HFA) 110 MCG/ACT inhaler Inhale 1 puff into the lungs 2 (two) times daily. (Patient not taking: Reported on 03/15/2019) 1 Inhaler 5  . montelukast (SINGULAIR) 10 MG tablet TAKE 1 TABLET BY MOUTH EVERYDAY AT BEDTIME.MAX ON INSURANCE 90 tablet 3  . omeprazole (PRILOSEC) 20 MG capsule Take 1 capsule (20 mg total) by mouth daily. 90 capsule 3  . pravastatin (PRAVACHOL) 20 MG tablet TAKE 1 TABLET BY MOUTH EVERY DAY.MAX ON INSURANCE 90 tablet 3  . PROAIR HFA 108 (90 Base) MCG/ACT inhaler TAKE 2 PUFFS BY MOUTH EVERY 6 HOURS AS NEEDED FOR WHEEZE OR SHORTNESS OF BREATH 8.5 Inhaler 2  . Rimegepant Sulfate (NURTEC) 75 MG TBDP Take 75 mg by mouth daily as needed. 8 tablet 6  . terbinafine (LAMISIL) 250 MG tablet Take 1 tablet (250 mg total) by mouth daily. 90 tablet 1  . topiramate (TOPAMAX) 50 MG tablet Take 1 tablet (50 mg total) by mouth 2 (two) times daily. 60 tablet 12  . venlafaxine XR (EFFEXOR-XR) 150 MG 24 hr capsule TAKE 1 CAPSULE (150 MG TOTAL) BY MOUTH DAILY WITH BREAKFAST.MAX ON INSURANCE 90 capsule 0   Facility-Administered Medications Prior to Visit  Medication Dose Route Frequency Provider Last Rate Last Admin  . sodium chloride flush (NS) 0.9 % injection 10 mL  10 mL Intravenous PRN Selim Durden R, MD   30 mL at 08/02/19 0930    PAST MEDICAL HISTORY: Past Medical History:  Diagnosis Date  . Asthma   . Chronic headaches   .  Chronic rhinitis 06/30/2011  . Depression   . GERD (gastroesophageal reflux disease) 06/30/2011  . Head injury 06/03/2019  . OSA (obstructive sleep apnea) 06/30/2011   PSG 08/05/11>>AHI 32.8, SpO2 82%. CPAP 9 cm H2O>> 9 (mostly central hypopneas), +R, +S     PAST SURGICAL HISTORY: Past Surgical History:  Procedure Laterality Date  . ACHILLES TENDON REPAIR     right  . APPENDECTOMY      FAMILY HISTORY: Family History  Problem Relation Age of Onset  . Asthma Other        father side  . Allergies Other        father  side    SOCIAL HISTORY: Social History   Socioeconomic History  . Marital status: Married    Spouse name: Chantel  . Number of children: 4  . Years of education: Not on file  . Highest education level: Master's degree (e.g., MA, MS, MEng, MEd, MSW, MBA)  Occupational History  . Occupation: Producer, television/film/video  Tobacco Use  . Smoking status: Never Smoker  . Smokeless tobacco: Never Used  . Tobacco comment: exposed to second hand smoke  Vaping Use  . Vaping Use: Never used  Substance and Sexual Activity  . Alcohol use: No    Alcohol/week: 0.0 standard drinks  . Drug use: No  . Sexual activity: Yes  Other Topics Concern  . Not on file  Social History Narrative   Lives with wife   Social Determinants of Health   Financial Resource Strain:   . Difficulty of Paying Living Expenses: Not on file  Food Insecurity:   . Worried About Programme researcher, broadcasting/film/video in the Last Year: Not on file  . Ran Out of Food in the Last Year: Not on file  Transportation Needs:   . Lack of Transportation (Medical): Not on file  . Lack of Transportation (Non-Medical): Not on file  Physical Activity:   . Days of Exercise per Week: Not on file  . Minutes of Exercise per Session: Not on file  Stress:   . Feeling of Stress : Not on file  Social Connections:   . Frequency of Communication with Friends and Family: Not on file  . Frequency of Social Gatherings with Friends and Family: Not on file  . Attends Religious Services: Not on file  . Active Member of Clubs or Organizations: Not on file  . Attends Banker Meetings: Not on file  . Marital Status: Not on file  Intimate Partner Violence:   . Fear of Current or Ex-Partner: Not on file  . Emotionally Abused: Not on file  . Physically Abused: Not on file  . Sexually Abused: Not on file     PHYSICAL EXAM  Video visit exam      DIAGNOSTIC DATA (LABS, IMAGING, TESTING) - I reviewed patient records, labs, notes, testing and  imaging myself where available.  Lab Results  Component Value Date   WBC 6.0 06/03/2019   HGB 15.7 06/03/2019   HCT 47.1 06/03/2019   MCV 91.6 06/03/2019   PLT 261 06/03/2019      Component Value Date/Time   NA 137 06/03/2019 2109   NA 140 01/18/2019 1522   K 4.1 06/03/2019 2109   CL 106 06/03/2019 2109   CO2 22 06/03/2019 2109   GLUCOSE 93 06/03/2019 2109   BUN 15 06/03/2019 2109   BUN 10 01/18/2019 1522   CREATININE 1.17 06/03/2019 2109   CREATININE 1.37 (H) 12/22/2015 0933   CALCIUM  9.0 06/03/2019 2109   PROT 7.6 06/03/2019 2109   PROT 7.2 01/18/2019 1522   ALBUMIN 4.4 06/03/2019 2109   ALBUMIN 4.5 01/18/2019 1522   AST 18 06/03/2019 2109   ALT 28 06/03/2019 2109   ALKPHOS 66 06/03/2019 2109   BILITOT 0.8 06/03/2019 2109   BILITOT 0.3 01/18/2019 1522   GFRNONAA >60 06/03/2019 2109   GFRAA >60 06/03/2019 2109   Lab Results  Component Value Date   CHOL 228 (H) 01/18/2019   HDL 39 (L) 01/18/2019   LDLCALC 165 (H) 01/18/2019   TRIG 134 01/18/2019   CHOLHDL 5.8 (H) 01/18/2019   Lab Results  Component Value Date   HGBA1C 6.4 (H) 01/18/2019   No results found for: VITAMINB12 Lab Results  Component Value Date   TSH 1.030 07/30/2016    08/05/11 PSG - IMPRESSIONS-RECOMMENDATIONS:  This study shows evidence for severe obstructive sleep apnea with an apnea-hypopnea index of 32.8 and oxygen saturation rate of 82%.  06/03/19 CT head [I reviewed images myself and agree with interpretation. -VRP]  1. Normal CT appearance of the brain. 2. No acute traumatic abnormality 3. ASPECTS is 10/10  06/25/19 MRI brain w/wo - normal  08/02/19 TTE - negative  06/24/19 carotid u/s  - incidental left ICA stenosis (asymptomatic; 40-59%). Continue medical mgmt.     ASSESSMENT AND PLAN  49 y.o. year old male here with:  Dx:  1. Migraine with aura and without status migrainosus, not intractable      PLAN:  POST-TRAUMATIC HEADACHE / LEFT SIDED WEAKNESS (likely   complicated migraine; TIA possible but less likely - continue aspirin 81mg  daily, statin, fish oil - follow up with PCP re: lipids, A1c and BP control - continue topiramate  - will appeal nurtec coverage (denied initially) - avoid triptans (due to possible TIA vs complicated migraine)  SEVERE OSA (2013 study) - follow up sleep study / OSA (Dr. 10-05-1970)  Meds ordered this encounter  Medications  . topiramate (TOPAMAX) 50 MG tablet    Sig: Take 1 tablet (50 mg total) by mouth 2 (two) times daily.    Dispense:  60 tablet    Refill:  12   Return in about 1 year (around 01/12/2021) for virtual visit (15 min).    13/10/2020, MD 01/13/2020, 11:08 AM Certified in Neurology, Neurophysiology and Neuroimaging  [facility] [address] [place], [place] [phone]

## 2020-01-21 ENCOUNTER — Encounter: Payer: Self-pay | Admitting: *Deleted

## 2020-01-21 NOTE — Telephone Encounter (Addendum)
Called [employer] to check nurtec appeal status, spoke with Cassandra who stated Nurtec was approved 10/14/19- 12/26/19. I advised we never got notified. She stated a fax was sent and a letter mailed. She advised Use cover my meds and submit new PA. Nurtec PA, key: [id number], [id number].  Your information has been submitted to [facility]. [employer] [place] will review the request and notify you of the determination decision directly, typically within 72 hours of receiving all information. You will also receive your request decision electronically. To check for an update later, open this request again from your dashboard. If [employer] has not responded within the specified timeframe or if you have any questions about your PA submission, contact [employer] Cross [place] directly at [phone].

## 2020-01-22 ENCOUNTER — Encounter: Payer: Self-pay | Admitting: *Deleted

## 2020-01-22 NOTE — Telephone Encounter (Signed)
[employer] approved Nurtec effective from 01/21/2020 through 01/19/2021. Sent him my chart to advise of approval.

## 2020-04-07 DIAGNOSIS — R7309 Other abnormal glucose: Secondary | ICD-10-CM | POA: Diagnosis not present

## 2020-05-05 DIAGNOSIS — R7309 Other abnormal glucose: Secondary | ICD-10-CM | POA: Diagnosis not present

## 2020-05-27 DIAGNOSIS — R059 Cough, unspecified: Secondary | ICD-10-CM | POA: Diagnosis not present

## 2020-05-27 DIAGNOSIS — Z8616 Personal history of COVID-19: Secondary | ICD-10-CM | POA: Diagnosis not present

## 2020-06-05 DIAGNOSIS — R7309 Other abnormal glucose: Secondary | ICD-10-CM | POA: Diagnosis not present

## 2020-06-22 DIAGNOSIS — S069X0A Unspecified intracranial injury without loss of consciousness, initial encounter: Secondary | ICD-10-CM | POA: Diagnosis not present

## 2020-06-22 DIAGNOSIS — R52 Pain, unspecified: Secondary | ICD-10-CM | POA: Diagnosis not present

## 2020-06-22 DIAGNOSIS — G43019 Migraine without aura, intractable, without status migrainosus: Secondary | ICD-10-CM | POA: Diagnosis not present

## 2020-06-22 DIAGNOSIS — G44321 Chronic post-traumatic headache, intractable: Secondary | ICD-10-CM | POA: Diagnosis not present

## 2020-07-05 DIAGNOSIS — R7309 Other abnormal glucose: Secondary | ICD-10-CM | POA: Diagnosis not present

## 2020-08-05 DIAGNOSIS — R7309 Other abnormal glucose: Secondary | ICD-10-CM | POA: Diagnosis not present

## 2020-08-10 DIAGNOSIS — S060X0D Concussion without loss of consciousness, subsequent encounter: Secondary | ICD-10-CM | POA: Diagnosis not present

## 2020-08-10 DIAGNOSIS — G44321 Chronic post-traumatic headache, intractable: Secondary | ICD-10-CM | POA: Diagnosis not present

## 2020-08-10 DIAGNOSIS — R52 Pain, unspecified: Secondary | ICD-10-CM | POA: Diagnosis not present

## 2020-09-04 DIAGNOSIS — R7309 Other abnormal glucose: Secondary | ICD-10-CM | POA: Diagnosis not present

## 2020-09-09 ENCOUNTER — Telehealth: Payer: Self-pay | Admitting: *Deleted

## 2020-09-09 NOTE — Telephone Encounter (Signed)
Received [employer] FMLA from patient via my chart. Advised him of payment needed; he has FU scheduled. Form placed on MD desk for review, signature.

## 2020-09-09 NOTE — Telephone Encounter (Signed)
[employer] FMLA completed, signed and sent to medical records for payment of fee and processing.

## 2020-09-22 ENCOUNTER — Telehealth: Payer: Self-pay | Admitting: *Deleted

## 2020-09-22 NOTE — Telephone Encounter (Signed)
Pt form re faxed to [employer] on 09/22/20.

## 2020-10-04 DIAGNOSIS — Z20822 Contact with and (suspected) exposure to covid-19: Secondary | ICD-10-CM | POA: Diagnosis not present

## 2020-10-05 DIAGNOSIS — R7309 Other abnormal glucose: Secondary | ICD-10-CM | POA: Diagnosis not present

## 2020-10-22 DIAGNOSIS — G43719 Chronic migraine without aura, intractable, without status migrainosus: Secondary | ICD-10-CM | POA: Diagnosis not present

## 2020-10-22 DIAGNOSIS — R52 Pain, unspecified: Secondary | ICD-10-CM | POA: Diagnosis not present

## 2020-10-22 DIAGNOSIS — G44321 Chronic post-traumatic headache, intractable: Secondary | ICD-10-CM | POA: Diagnosis not present

## 2020-10-26 DIAGNOSIS — J4540 Moderate persistent asthma, uncomplicated: Secondary | ICD-10-CM | POA: Diagnosis not present

## 2020-10-26 DIAGNOSIS — Z6839 Body mass index (BMI) 39.0-39.9, adult: Secondary | ICD-10-CM | POA: Diagnosis not present

## 2020-11-05 DIAGNOSIS — R7309 Other abnormal glucose: Secondary | ICD-10-CM | POA: Diagnosis not present

## 2020-11-26 DIAGNOSIS — G43719 Chronic migraine without aura, intractable, without status migrainosus: Secondary | ICD-10-CM | POA: Diagnosis not present

## 2020-11-26 DIAGNOSIS — S060X0D Concussion without loss of consciousness, subsequent encounter: Secondary | ICD-10-CM | POA: Diagnosis not present

## 2020-11-26 DIAGNOSIS — G44321 Chronic post-traumatic headache, intractable: Secondary | ICD-10-CM | POA: Diagnosis not present

## 2020-11-26 DIAGNOSIS — G43019 Migraine without aura, intractable, without status migrainosus: Secondary | ICD-10-CM | POA: Diagnosis not present

## 2020-11-26 DIAGNOSIS — R52 Pain, unspecified: Secondary | ICD-10-CM | POA: Diagnosis not present

## 2020-12-05 DIAGNOSIS — Z7189 Other specified counseling: Secondary | ICD-10-CM | POA: Diagnosis not present

## 2020-12-05 DIAGNOSIS — R7309 Other abnormal glucose: Secondary | ICD-10-CM | POA: Diagnosis not present

## 2020-12-07 DIAGNOSIS — E785 Hyperlipidemia, unspecified: Secondary | ICD-10-CM | POA: Diagnosis not present

## 2020-12-07 DIAGNOSIS — K219 Gastro-esophageal reflux disease without esophagitis: Secondary | ICD-10-CM | POA: Diagnosis not present

## 2020-12-07 DIAGNOSIS — Z9189 Other specified personal risk factors, not elsewhere classified: Secondary | ICD-10-CM | POA: Diagnosis not present

## 2020-12-07 DIAGNOSIS — E669 Obesity, unspecified: Secondary | ICD-10-CM | POA: Diagnosis not present

## 2020-12-07 DIAGNOSIS — R7309 Other abnormal glucose: Secondary | ICD-10-CM | POA: Diagnosis not present

## 2020-12-07 DIAGNOSIS — Z1159 Encounter for screening for other viral diseases: Secondary | ICD-10-CM | POA: Diagnosis not present

## 2020-12-07 DIAGNOSIS — Z23 Encounter for immunization: Secondary | ICD-10-CM | POA: Diagnosis not present

## 2020-12-07 DIAGNOSIS — J4540 Moderate persistent asthma, uncomplicated: Secondary | ICD-10-CM | POA: Diagnosis not present

## 2020-12-08 ENCOUNTER — Other Ambulatory Visit: Payer: Self-pay | Admitting: Diagnostic Neuroimaging

## 2021-01-05 DIAGNOSIS — Z7189 Other specified counseling: Secondary | ICD-10-CM | POA: Diagnosis not present

## 2021-01-05 DIAGNOSIS — R7309 Other abnormal glucose: Secondary | ICD-10-CM | POA: Diagnosis not present

## 2021-01-06 ENCOUNTER — Telehealth: Payer: Self-pay | Admitting: *Deleted

## 2021-01-06 DIAGNOSIS — G44321 Chronic post-traumatic headache, intractable: Secondary | ICD-10-CM | POA: Diagnosis not present

## 2021-01-06 DIAGNOSIS — R52 Pain, unspecified: Secondary | ICD-10-CM | POA: Diagnosis not present

## 2021-01-06 DIAGNOSIS — G43719 Chronic migraine without aura, intractable, without status migrainosus: Secondary | ICD-10-CM | POA: Diagnosis not present

## 2021-01-06 NOTE — Telephone Encounter (Signed)
Nurtec PA, key [id number]. Your information has been submitted to [facility]. [employer] [place] will review the request and notify you of the determination decision directly, typically within 72 hours of receiving all information. If [employer] has not responded within the specified timeframe or if you have any questions about your PA submission, contact [employer] Cross [place] directly at [phone].

## 2021-01-11 ENCOUNTER — Encounter: Payer: Self-pay | Admitting: *Deleted

## 2021-01-11 NOTE — Telephone Encounter (Signed)
Nurtec approved Effective from 01/06/2021 through 01/05/2022.sent my chart to advise patient.

## 2021-02-01 ENCOUNTER — Ambulatory Visit: Payer: BC Managed Care – PPO | Admitting: Diagnostic Neuroimaging

## 2021-02-01 DIAGNOSIS — J4541 Moderate persistent asthma with (acute) exacerbation: Secondary | ICD-10-CM | POA: Diagnosis not present

## 2021-02-01 DIAGNOSIS — Z6839 Body mass index (BMI) 39.0-39.9, adult: Secondary | ICD-10-CM | POA: Diagnosis not present

## 2021-02-01 DIAGNOSIS — H6591 Unspecified nonsuppurative otitis media, right ear: Secondary | ICD-10-CM | POA: Diagnosis not present

## 2021-02-02 ENCOUNTER — Encounter: Payer: Self-pay | Admitting: Diagnostic Neuroimaging

## 2021-02-04 DIAGNOSIS — Z7189 Other specified counseling: Secondary | ICD-10-CM | POA: Diagnosis not present

## 2021-02-04 DIAGNOSIS — R7309 Other abnormal glucose: Secondary | ICD-10-CM | POA: Diagnosis not present

## 2021-02-15 DIAGNOSIS — R059 Cough, unspecified: Secondary | ICD-10-CM | POA: Diagnosis not present

## 2021-02-15 DIAGNOSIS — Z6839 Body mass index (BMI) 39.0-39.9, adult: Secondary | ICD-10-CM | POA: Diagnosis not present

## 2021-02-15 DIAGNOSIS — R918 Other nonspecific abnormal finding of lung field: Secondary | ICD-10-CM | POA: Diagnosis not present

## 2021-02-15 DIAGNOSIS — R052 Subacute cough: Secondary | ICD-10-CM | POA: Diagnosis not present

## 2021-04-08 DIAGNOSIS — S060X0S Concussion without loss of consciousness, sequela: Secondary | ICD-10-CM | POA: Diagnosis not present

## 2021-04-08 DIAGNOSIS — G44321 Chronic post-traumatic headache, intractable: Secondary | ICD-10-CM | POA: Diagnosis not present

## 2021-04-08 DIAGNOSIS — G43719 Chronic migraine without aura, intractable, without status migrainosus: Secondary | ICD-10-CM | POA: Diagnosis not present

## 2021-04-08 DIAGNOSIS — R52 Pain, unspecified: Secondary | ICD-10-CM | POA: Diagnosis not present

## 2021-06-04 IMAGING — CT CT CERVICAL SPINE W/O CM
3 of 4 series · 13 of 33 positions shown, 16 images · non-contrast
Comparison: None.

CLINICAL DATA: Fell, posterior head trauma, headache and blurred
vision

EXAM:
CT CERVICAL SPINE WITHOUT CONTRAST
TECHNIQUE: Multidetector CT imaging of the cervical spine was performed without
intravenous contrast. Multiplanar CT image reconstructions were also
generated.

[Series 4: sagittal bone · sagittal · 0.30mm/px · 5 of 61 slices shown, 6 images]
[im 21/61  bone]
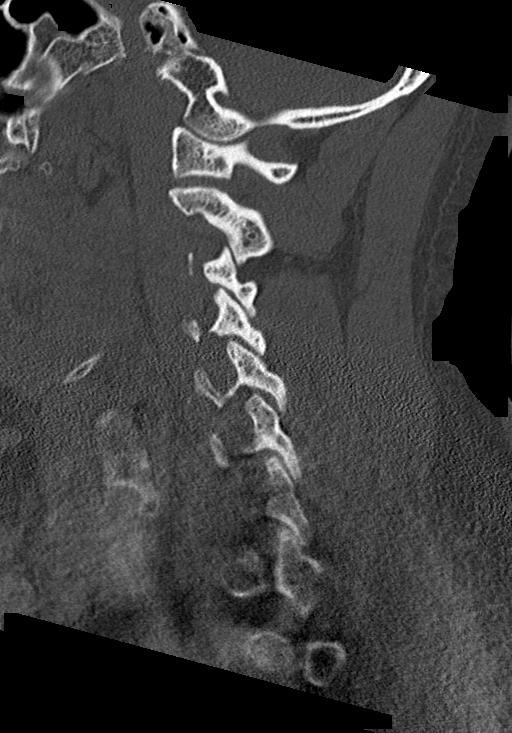
[im 26/61  bone]
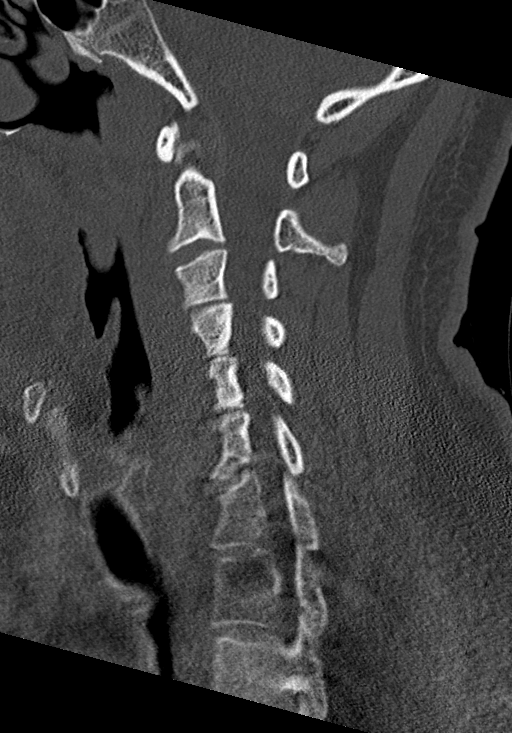
[im 31/61  soft-tissue]
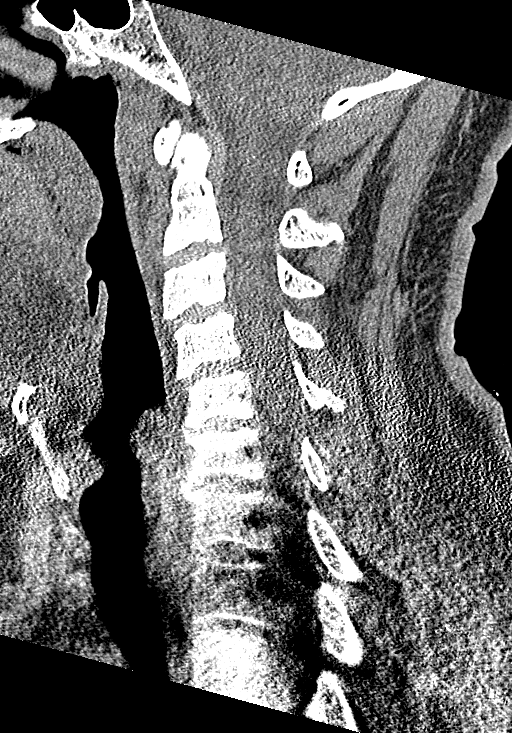
[im 31/61  bone]
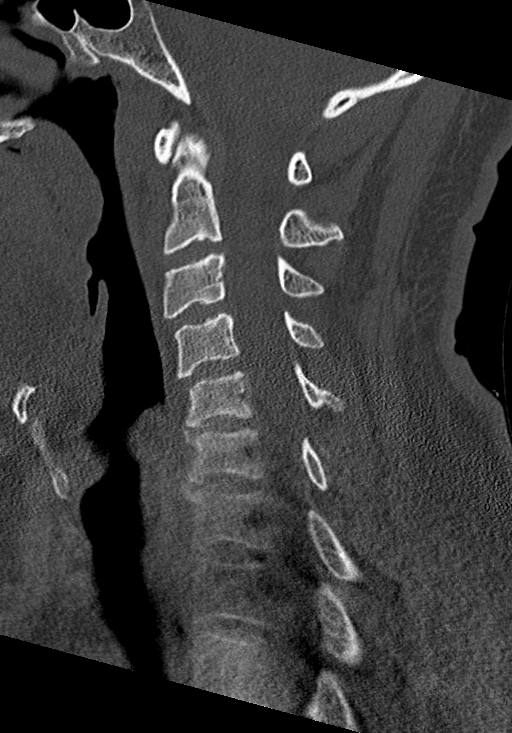
[im 36/61  bone]
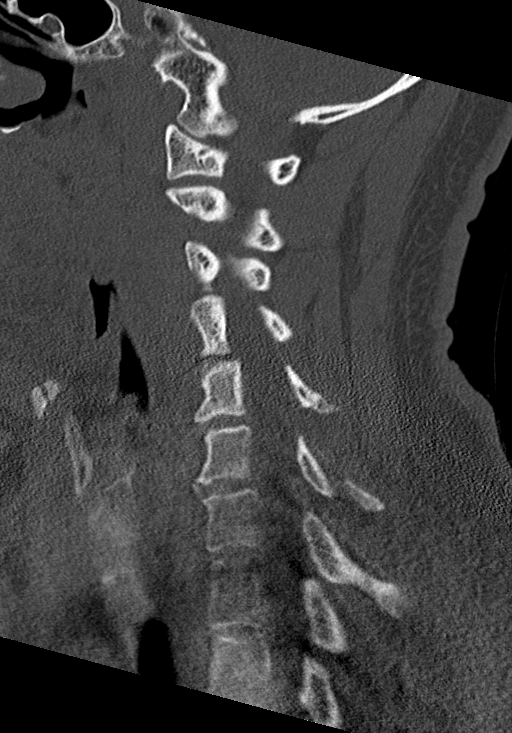
[im 41/61  bone]
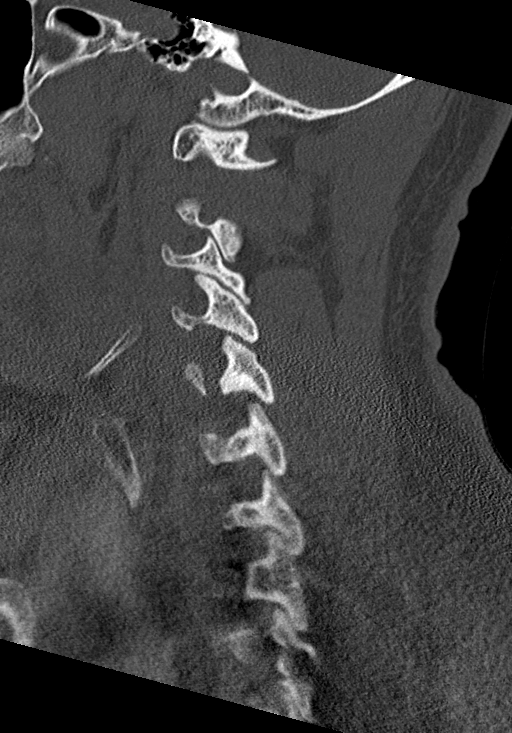

[Series 5: coronal bone · coronal · 0.29mm/px · 3 of 65 slices shown]
[im 13/65  bone]
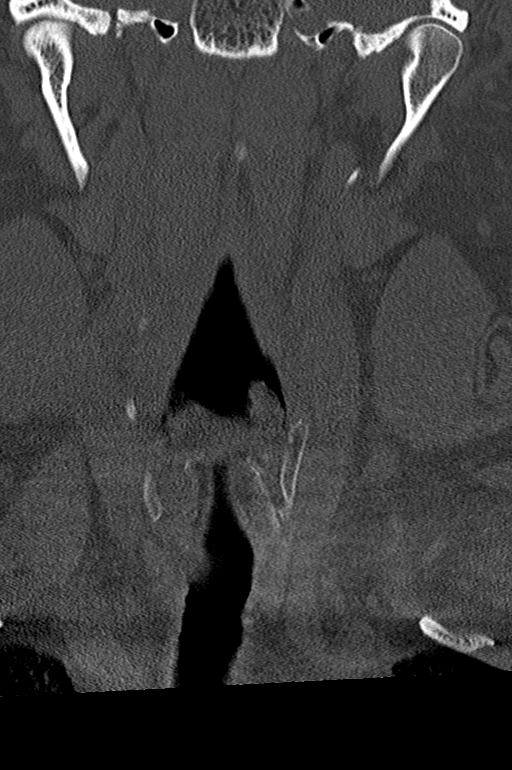
[im 26/65  bone]
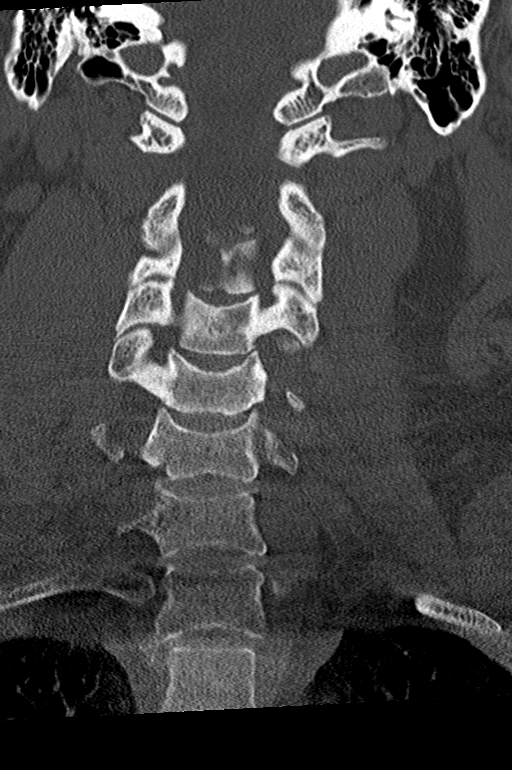
[im 39/65  bone]
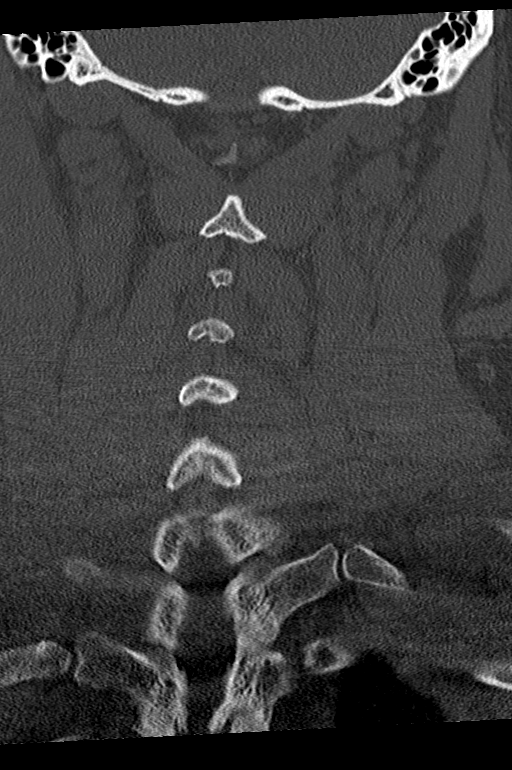

[Series 6: orthogonal bone · axial · 0.26mm/px · z∈[+747,+877]mm · 5 of 102 slices shown, 7 images]
[im 17/102  soft-tissue]
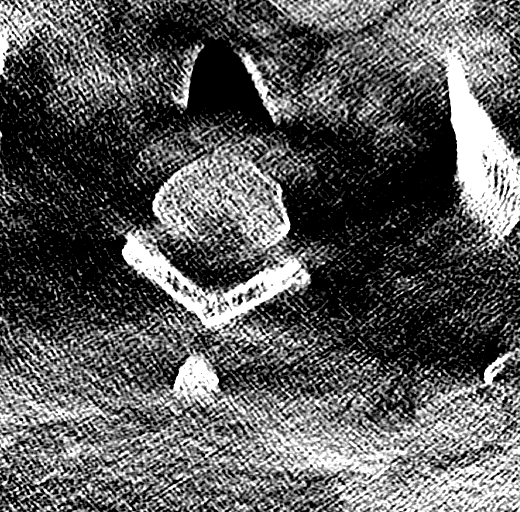
[im 17/102  bone]
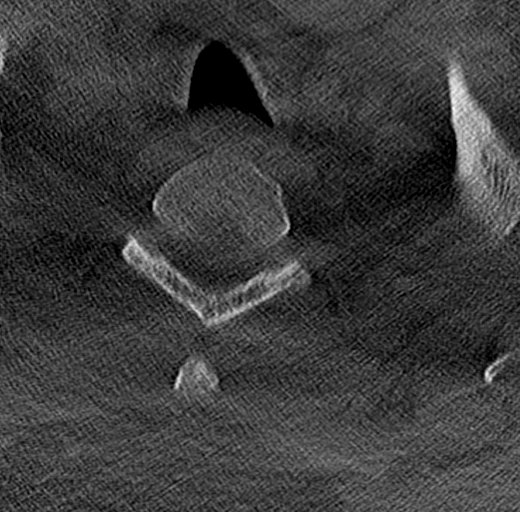
[im 34/102  bone]
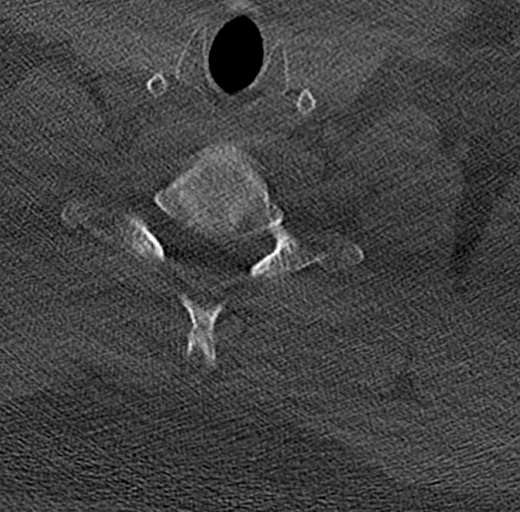
[im 51/102  bone]
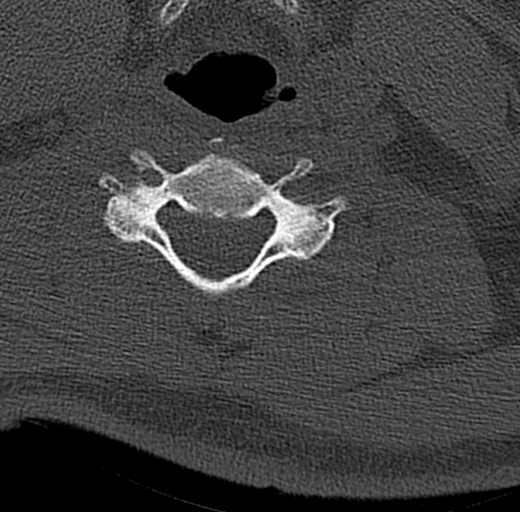
[im 68/102  bone]
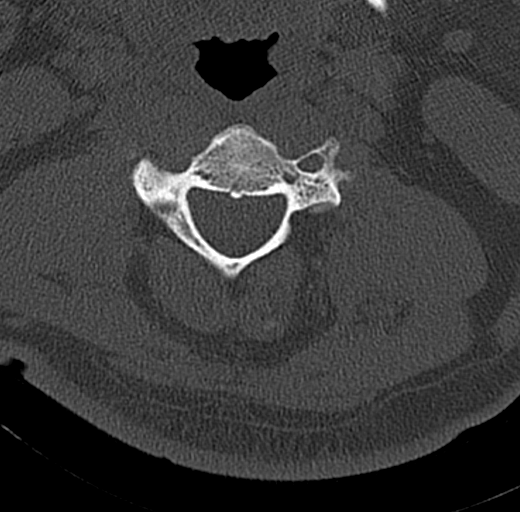
[im 85/102  soft-tissue]
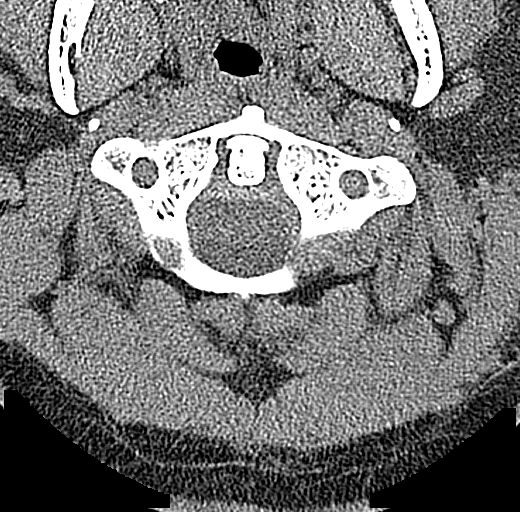
[im 85/102  bone]
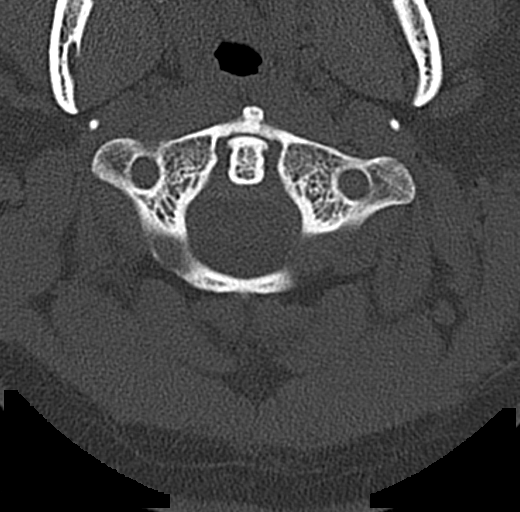

[13 of 33 positions shown; findings below may reference images not displayed]

FINDINGS: Alignment: Loss of cervical lordosis is likely positional. Otherwise
alignment is anatomic.

Skull base and vertebrae: No acute displaced fractures.

Soft tissues and spinal canal: No prevertebral fluid or swelling. No
visible canal hematoma.

Disc levels: Mild multilevel spondylosis most pronounced at C5-6 and
C6-7 with anterior osteophyte formation. No bony encroachment on the
central canal or neural foramina.

Upper chest: Airway is patent. Visualized portions of the lung
apices are clear.

Other: Reconstructed images demonstrate no additional findings.
IMPRESSION: 1. Minimal cervical spondylosis.  No acute fracture.

## 2021-06-07 DIAGNOSIS — G44321 Chronic post-traumatic headache, intractable: Secondary | ICD-10-CM | POA: Diagnosis not present

## 2021-06-07 DIAGNOSIS — G43719 Chronic migraine without aura, intractable, without status migrainosus: Secondary | ICD-10-CM | POA: Diagnosis not present

## 2021-06-07 DIAGNOSIS — S060X0D Concussion without loss of consciousness, subsequent encounter: Secondary | ICD-10-CM | POA: Diagnosis not present

## 2021-06-07 DIAGNOSIS — R52 Pain, unspecified: Secondary | ICD-10-CM | POA: Diagnosis not present

## 2021-06-21 DIAGNOSIS — F331 Major depressive disorder, recurrent, moderate: Secondary | ICD-10-CM | POA: Diagnosis not present

## 2021-06-21 DIAGNOSIS — Z6838 Body mass index (BMI) 38.0-38.9, adult: Secondary | ICD-10-CM | POA: Diagnosis not present

## 2021-06-21 DIAGNOSIS — Z1211 Encounter for screening for malignant neoplasm of colon: Secondary | ICD-10-CM | POA: Diagnosis not present

## 2021-06-21 DIAGNOSIS — R7303 Prediabetes: Secondary | ICD-10-CM | POA: Diagnosis not present

## 2021-07-08 DIAGNOSIS — F4323 Adjustment disorder with mixed anxiety and depressed mood: Secondary | ICD-10-CM | POA: Diagnosis not present

## 2021-07-15 DIAGNOSIS — F4323 Adjustment disorder with mixed anxiety and depressed mood: Secondary | ICD-10-CM | POA: Diagnosis not present

## 2021-07-21 DIAGNOSIS — F331 Major depressive disorder, recurrent, moderate: Secondary | ICD-10-CM | POA: Diagnosis not present

## 2021-07-23 DIAGNOSIS — F4323 Adjustment disorder with mixed anxiety and depressed mood: Secondary | ICD-10-CM | POA: Diagnosis not present

## 2021-08-04 DIAGNOSIS — F331 Major depressive disorder, recurrent, moderate: Secondary | ICD-10-CM | POA: Diagnosis not present

## 2021-08-10 DIAGNOSIS — F4323 Adjustment disorder with mixed anxiety and depressed mood: Secondary | ICD-10-CM | POA: Diagnosis not present

## 2021-08-17 DIAGNOSIS — G43719 Chronic migraine without aura, intractable, without status migrainosus: Secondary | ICD-10-CM | POA: Diagnosis not present

## 2021-08-17 DIAGNOSIS — R52 Pain, unspecified: Secondary | ICD-10-CM | POA: Diagnosis not present

## 2021-08-17 DIAGNOSIS — F4323 Adjustment disorder with mixed anxiety and depressed mood: Secondary | ICD-10-CM | POA: Diagnosis not present

## 2021-08-17 DIAGNOSIS — G44321 Chronic post-traumatic headache, intractable: Secondary | ICD-10-CM | POA: Diagnosis not present

## 2021-08-17 DIAGNOSIS — Z87820 Personal history of traumatic brain injury: Secondary | ICD-10-CM | POA: Diagnosis not present

## 2021-08-24 DIAGNOSIS — F4323 Adjustment disorder with mixed anxiety and depressed mood: Secondary | ICD-10-CM | POA: Diagnosis not present

## 2021-08-27 DIAGNOSIS — H2513 Age-related nuclear cataract, bilateral: Secondary | ICD-10-CM | POA: Diagnosis not present

## 2021-08-27 DIAGNOSIS — H50111 Monocular exotropia, right eye: Secondary | ICD-10-CM | POA: Diagnosis not present

## 2021-08-27 DIAGNOSIS — H5213 Myopia, bilateral: Secondary | ICD-10-CM | POA: Diagnosis not present

## 2021-08-27 DIAGNOSIS — G43109 Migraine with aura, not intractable, without status migrainosus: Secondary | ICD-10-CM | POA: Diagnosis not present

## 2021-09-03 DIAGNOSIS — K219 Gastro-esophageal reflux disease without esophagitis: Secondary | ICD-10-CM | POA: Diagnosis not present

## 2021-09-03 DIAGNOSIS — K5730 Diverticulosis of large intestine without perforation or abscess without bleeding: Secondary | ICD-10-CM | POA: Diagnosis not present

## 2021-09-03 DIAGNOSIS — G4730 Sleep apnea, unspecified: Secondary | ICD-10-CM | POA: Diagnosis not present

## 2021-09-03 DIAGNOSIS — G43909 Migraine, unspecified, not intractable, without status migrainosus: Secondary | ICD-10-CM | POA: Diagnosis not present

## 2021-09-03 DIAGNOSIS — Z1211 Encounter for screening for malignant neoplasm of colon: Secondary | ICD-10-CM | POA: Diagnosis not present

## 2021-09-03 DIAGNOSIS — K635 Polyp of colon: Secondary | ICD-10-CM | POA: Diagnosis not present

## 2021-09-03 DIAGNOSIS — F32A Depression, unspecified: Secondary | ICD-10-CM | POA: Diagnosis not present

## 2021-09-03 DIAGNOSIS — Z79899 Other long term (current) drug therapy: Secondary | ICD-10-CM | POA: Diagnosis not present

## 2021-09-03 DIAGNOSIS — J45909 Unspecified asthma, uncomplicated: Secondary | ICD-10-CM | POA: Diagnosis not present

## 2021-09-08 DIAGNOSIS — F4320 Adjustment disorder, unspecified: Secondary | ICD-10-CM | POA: Diagnosis not present
